# Patient Record
Sex: Female | Born: 1950 | Race: Black or African American | Hispanic: No
Health system: Southern US, Community
[De-identification: ages and names within clinical notes are randomized; demographics above are authoritative.]

## PROBLEM LIST (undated history)

## (undated) DIAGNOSIS — J9621 Acute and chronic respiratory failure with hypoxia: Secondary | ICD-10-CM

## (undated) DIAGNOSIS — I482 Chronic atrial fibrillation, unspecified: Secondary | ICD-10-CM

## (undated) DIAGNOSIS — J189 Pneumonia, unspecified organism: Secondary | ICD-10-CM

## (undated) DIAGNOSIS — R6521 Severe sepsis with septic shock: Secondary | ICD-10-CM

## (undated) DIAGNOSIS — A419 Sepsis, unspecified organism: Secondary | ICD-10-CM

## (undated) DIAGNOSIS — J90 Pleural effusion, not elsewhere classified: Secondary | ICD-10-CM

## (undated) DIAGNOSIS — K651 Peritoneal abscess: Secondary | ICD-10-CM

## (undated) DIAGNOSIS — I420 Dilated cardiomyopathy: Secondary | ICD-10-CM

## (undated) HISTORY — PX: BREAST SURGERY: SHX581

## (undated) HISTORY — PX: KNEE SURGERY: SHX244

## (undated) HISTORY — PX: COLECTOMY: SHX59

## (undated) HISTORY — PX: TUBAL LIGATION: SHX77

---

## 2017-04-04 ENCOUNTER — Inpatient Hospital Stay
Admission: AC | Admit: 2017-04-04 | Discharge: 2017-04-25 | Disposition: A | Discharge status: Skilled Nursing Facility | Payer: Self-pay | Source: Other Acute Inpatient Hospital | Attending: Internal Medicine | Admitting: Internal Medicine

## 2017-04-04 DIAGNOSIS — J9690 Respiratory failure, unspecified, unspecified whether with hypoxia or hypercapnia: Secondary | ICD-10-CM

## 2017-04-04 DIAGNOSIS — Z931 Gastrostomy status: Secondary | ICD-10-CM

## 2017-04-05 LAB — CBC WITH DIFFERENTIAL/PLATELET
BASOS PCT: 0 %
Basophils Absolute: 0.1 10*3/uL (ref 0.0–0.1)
EOS ABS: 0.1 10*3/uL (ref 0.0–0.7)
Eosinophils Relative: 1 %
HEMATOCRIT: 29.9 % — AB (ref 36.0–46.0)
HEMOGLOBIN: 9.8 g/dL — AB (ref 12.0–15.0)
LYMPHS ABS: 4 10*3/uL (ref 0.7–4.0)
Lymphocytes Relative: 33 %
MCH: 30.2 pg (ref 26.0–34.0)
MCHC: 32.8 g/dL (ref 30.0–36.0)
MCV: 92.3 fL (ref 78.0–100.0)
MONO ABS: 0.7 10*3/uL (ref 0.1–1.0)
Monocytes Relative: 6 %
NEUTROS ABS: 7.2 10*3/uL (ref 1.7–7.7)
NEUTROS PCT: 60 %
Platelets: 464 10*3/uL — ABNORMAL HIGH (ref 150–400)
RBC: 3.24 MIL/uL — ABNORMAL LOW (ref 3.87–5.11)
RDW: 17.5 % — AB (ref 11.5–15.5)
WBC: 12.1 10*3/uL — ABNORMAL HIGH (ref 4.0–10.5)

## 2017-04-05 LAB — COMPREHENSIVE METABOLIC PANEL
ALBUMIN: 2.2 g/dL — AB (ref 3.5–5.0)
ALK PHOS: 97 U/L (ref 38–126)
ALT: 9 U/L — AB (ref 14–54)
AST: 20 U/L (ref 15–41)
Anion gap: 11 (ref 5–15)
BUN: 30 mg/dL — ABNORMAL HIGH (ref 6–20)
CALCIUM: 9.1 mg/dL (ref 8.9–10.3)
CHLORIDE: 105 mmol/L (ref 101–111)
CO2: 21 mmol/L — ABNORMAL LOW (ref 22–32)
CREATININE: 2.31 mg/dL — AB (ref 0.44–1.00)
GFR calc Af Amer: 24 mL/min — ABNORMAL LOW (ref >60)
GFR calc non Af Amer: 21 mL/min — ABNORMAL LOW (ref >60)
GLUCOSE: 111 mg/dL — AB (ref 65–99)
Potassium: 3.5 mmol/L (ref 3.5–5.1)
SODIUM: 137 mmol/L (ref 135–145)
Total Bilirubin: 0.7 mg/dL (ref 0.3–1.2)
Total Protein: 7.1 g/dL (ref 6.5–8.1)

## 2017-04-05 LAB — PROTIME-INR
INR: 1.7
Prothrombin Time: 19.8 seconds — ABNORMAL HIGH (ref 11.4–15.2)

## 2017-04-05 MED ORDER — GENERIC EXTERNAL MEDICATION
200.00 | Status: DC
Start: 2017-04-06 — End: 2017-04-05

## 2017-04-05 MED ORDER — GENERIC EXTERNAL MEDICATION
2.00 | Status: DC
Start: 2017-04-05 — End: 2017-04-05

## 2017-04-05 MED ORDER — GENERIC EXTERNAL MEDICATION
Status: DC
Start: ? — End: 2017-04-05

## 2017-04-05 MED ORDER — GENERIC EXTERNAL MEDICATION
2.00 | Status: DC
Start: ? — End: 2017-04-05

## 2017-04-05 MED ORDER — METOPROLOL TARTRATE 50 MG PO TABS
50.00 | ORAL_TABLET | ORAL | Status: DC
Start: 2017-04-04 — End: 2017-04-05

## 2017-04-05 MED ORDER — PANTOPRAZOLE SODIUM 40 MG PO TBEC
40.00 | DELAYED_RELEASE_TABLET | ORAL | Status: DC
Start: 2017-04-05 — End: 2017-04-05

## 2017-04-05 MED ORDER — ONDANSETRON HCL 4 MG/2ML IJ SOLN
4.00 | INTRAMUSCULAR | Status: DC
Start: ? — End: 2017-04-05

## 2017-04-05 MED ORDER — GENERIC EXTERNAL MEDICATION
1.00 | Status: DC
Start: 2017-04-05 — End: 2017-04-05

## 2017-04-05 MED ORDER — APIXABAN 2.5 MG PO TABS
2.50 | ORAL_TABLET | ORAL | Status: DC
Start: 2017-04-04 — End: 2017-04-05

## 2017-04-05 MED ORDER — METRONIDAZOLE IN NACL 5-0.79 MG/ML-% IV SOLN
500.00 | INTRAVENOUS | Status: DC
Start: 2017-04-04 — End: 2017-04-05

## 2017-04-05 MED ORDER — METOPROLOL TARTRATE 5 MG/5ML IV SOLN
5.00 | INTRAVENOUS | Status: DC
Start: ? — End: 2017-04-05

## 2017-04-05 MED ORDER — ACETAMINOPHEN 325 MG PO TABS
650.00 | ORAL_TABLET | ORAL | Status: DC
Start: ? — End: 2017-04-05

## 2017-04-05 MED ORDER — ISOSORB DINITRATE-HYDRALAZINE 20-37.5 MG PO TABS
1.00 | ORAL_TABLET | ORAL | Status: DC
Start: 2017-04-04 — End: 2017-04-05

## 2017-04-05 MED ORDER — HEPARIN SODIUM (PORCINE) 1000 UNIT/ML IJ SOLN
1000.00 | INTRAMUSCULAR | Status: DC
Start: ? — End: 2017-04-05

## 2017-04-05 MED ORDER — GENERIC EXTERNAL MEDICATION
450.00 | Status: DC
Start: ? — End: 2017-04-05

## 2017-04-05 MED ORDER — MEGESTROL ACETATE 400 MG/10ML PO SUSP
400.00 | ORAL | Status: DC
Start: 2017-04-05 — End: 2017-04-05

## 2017-04-05 NOTE — Consult Note (Signed)
[facility] CONSULT NOTE    Date: 04/05/2017                  Patient Name:  Elizabeth Rodriguez  MRN: [id number]  DOB: 1950/06/23  Age / Sex: 67 y.o., female         PCP: No primary care provider on file.                 Service Requesting Consult: Hospitalist                 Reason for Consult: Acute renal failure            History of Present Illness: Patient is a 67 y.o. female with a PMHx of atrial fibrillation, hypertension, osteoarthritis, congestive heart failure ejection fraction 30-35%, recent peritonitis from perforated sigmoid colon status post sigmoid colectomy with colostomy placement, acute renal failure requiring dialysis, acute respiratory failure requiring ventilator support, recent septic shock due to perforated viscus who was admitted to [place] for ongoing treatment.  Patient was admitted to outside hospital from 03/05/17 the April 04, 2017.  Patient originally presented with abdominal pain.  She was found to have perforated viscus and was taken to the operating room.  She was found to have a 3 cm tear in the anterior wall of the distal sigmoid with stool in the pelvis as well as a large amount of purulent fluid in the abdomen.  Subsequently colectomy was performed and colostomy was placed.  Given the septic shock she developed acute renal failure and required dialysis.  Her last dialysis treatment was on March 24, 2017.  Her renal function appears to be improving.  BUN is currently 30 with a creatinine of 2.3 and EGFR of 24.  She also appears to have mild anemia with a hemoglobin of 9.8.   Medications: acetaminophen (TYLENOL) 325 mg tablet Take 2 tablets (650 mg total) by mouth every 6 (six) hours as needed for mild pain or fever .  apixaban (ELIQUIS) 2.5 mg tablet Take 1 tablet (2.5 mg total) by mouth two times a day .  calcium carbonate (TUMS) 200 mg calcium (500 mg) chewable tablet Take 2 tablets (1,000 mg total) by mouth three times  a day as needed for indigestion or heartburn .  cefTRIAXone 2 g in sodium chloride 0.9 % 50 mL IVPB Infuse 2 g into a venous catheter daily .  isosorbide-hydrALAZINE (BIDIL) 20-37.5 mg per tablet Take 1 tablet by mouth two times a day .  megestrol (MEGACE) 400 mg/10 mL (10 mL) suspension oral suspension Take 10 mL (400 mg total) by mouth every morning .  metoprolol tartrate (LOPRESSOR) 50 mg tablet Take 1 tablet (50 mg total) by mouth every 6 (six) hours .  MetroNIDAZOLE in NaCl, iso-os, (FLAGYL) 500 mg/100 mL IVPB Infuse 100 mL (500 mg total) into a venous catheter every 8 (eight) hours .  multivitamin with folic acid (DIALYVITE) tablet tablet Take 1 tablet by mouth daily .  ondansetron (ZOFRAN) 2 mg/mL injection Infuse 2 mL (4 mg total) into a venous catheter every 6 (six) hours as needed for nausea .  oxyCODONE-acetaminophen (PERCOCET) 5-325 mg per tablet Take 2 tablets by mouth every 4 (four) hours as needed for severe pain .  oxyCODONE-acetaminophen (PERCOCET) 5-325 mg per tablet Take 1 tablet by mouth every 4 (four) hours as needed for moderate pain .  pantoprazole (PROTONIX) 40 mg EC tablet Take 1 tablet (40 mg total) by mouth daily .  Allergies: No known drug allergies   Past Medical History: Atrial fibrillation Hypertension Chronic kidney disease stage III baseline creatinine 1.5 Perforated viscus status post sigmoid colectomy and colostomy Vitamin D deficiency Recent septic shock Recent acute respiratory failure status post extubation Congestive heart failure ejection fraction 30-35%    Past Surgical History: Carpal tunnel release Bilateral tubal ligation Breast lumpectomy Left knee surgery Sigmoid colectomy and colostomy placement    Family History: Family history significant for hypertension.  Social History: Lives in [place], [place].  Denies tobacco, ETOH, or illicits.  Review of Systems: Review of Systems  Constitutional: Positive for  malaise/fatigue. Negative for chills and fever.  HENT: Negative for hearing loss, nosebleeds and tinnitus.   Eyes: Negative for blurred vision, double vision and photophobia.  Respiratory: Positive for cough and shortness of breath.   Cardiovascular: Negative for chest pain, palpitations and orthopnea.  Gastrointestinal: Positive for abdominal pain. Negative for heartburn, nausea and vomiting.  Genitourinary: Negative for dysuria, frequency and urgency.  Musculoskeletal: Negative for joint pain and myalgias.  Skin: Negative for itching and rash.  Neurological: Positive for weakness. Negative for dizziness and focal weakness.  Endo/Heme/Allergies: Negative for polydipsia. Does not bruise/bleed easily.  Psychiatric/Behavioral: Negative for depression and hallucinations.     Vital Signs: Temperature 98.8 pulse 106 respirations 31 blood pressure 121/81  Weight trends: There were no vitals filed for this visit.  Physical Exam: General: NAD, laying in bed  Head: Normocephalic, atraumatic.  Eyes: Anicteric, EOMI  Nose: Mucous membranes moist, not inflammed, nonerythematous.  Throat: Oropharynx nonerythematous, no exudate appreciated.   Neck: Supple, trachea midline.  Lungs:  Normal respiratory effort. Clear to auscultation BL without crackles or wheezes.  Heart: RRR. S1 and S2 normal without gallop, murmur, or rubs.  Abdomen:  BS normal, colostomy in place, wound vac in place  Extremities: No pretibial edema.  Neurologic: A&O X3, Motor strength is 5/5 in the all 4 extremities  Skin: No visible rashes, scars.    Lab results: Basic Metabolic Panel: Recent Labs  Lab 04/05/17 0635  NA 137  K 3.5  CL 105  CO2 21*  GLUCOSE 111*  BUN 30*  CREATININE 2.31*  CALCIUM 9.1    Liver Function Tests: Recent Labs  Lab 04/05/17 0635  AST 20  ALT 9*  ALKPHOS 97  BILITOT 0.7  PROT 7.1  ALBUMIN 2.2*   No results for input(s): LIPASE, AMYLASE in the last 168 hours. No results for  input(s): AMMONIA in the last 168 hours.  CBC: Recent Labs  Lab 04/05/17 0635  WBC 12.1*  NEUTROABS 7.2  HGB 9.8*  HCT 29.9*  MCV 92.3  PLT 464*    Cardiac Enzymes: No results for input(s): CKTOTAL, CKMB, CKMBINDEX, TROPONINI in the last 168 hours.  BNP: Invalid input(s): POCBNP  CBG: No results for input(s): GLUCAP in the last 168 hours.  Microbiology: No results found for this or any previous visit.  Coagulation Studies: Recent Labs    04/05/17 0635  LABPROT 19.8*  INR 1.70    Urinalysis: No results for input(s): COLORURINE, LABSPEC, PHURINE, GLUCOSEU, HGBUR, BILIRUBINUR, KETONESUR, PROTEINUR, UROBILINOGEN, NITRITE, LEUKOCYTESUR in the last 72 hours.  Invalid input(s): APPERANCEUR    Imaging:  No results found.   Assessment & Plan: Pt is a 67 y.o. female with a PMHx of atrial fibrillation, hypertension, osteoarthritis, congestive heart failure ejection fraction 30-35%, recent peritonitis from perforated sigmoid colon status post sigmoid colectomy with colostomy placement, acute renal failure requiring dialysis, acute respiratory failure requiring  ventilator support, recent septic shock due to perforated viscus who was admitted to [place] for ongoing treatment.  1.  Acute renal failure/chronic kidney disease stage III.  The patient's renal function appears to be improving.  Creatinine currently down to 2.31.  BUN currently 30.  No acute indication for dialysis.  Encourage the patient to maintain adequate fluid hydration status.  Baseline creatinine appears to be 1.5 but unclear as to whether she will reach her prior baseline given the severity of her acute renal failure recently.  2.  Anemia of chronic kidney disease.  Hemoglobin currently 9.8.  No indication for Procrit at the moment.  Continue to periodically monitor CBC.  3.  Hypertension.  Maintain the patient on metoprolol for now.  4.  Thanks for consultation.

## 2017-04-12 LAB — COMPREHENSIVE METABOLIC PANEL
ALT: 9 U/L — ABNORMAL LOW (ref 14–54)
ANION GAP: 6 (ref 5–15)
AST: 23 U/L (ref 15–41)
Albumin: 2.2 g/dL — ABNORMAL LOW (ref 3.5–5.0)
Alkaline Phosphatase: 67 U/L (ref 38–126)
BILIRUBIN TOTAL: 0.4 mg/dL (ref 0.3–1.2)
BUN: 16 mg/dL (ref 6–20)
CHLORIDE: 113 mmol/L — AB (ref 101–111)
CO2: 21 mmol/L — ABNORMAL LOW (ref 22–32)
Calcium: 9.1 mg/dL (ref 8.9–10.3)
Creatinine, Ser: 1.14 mg/dL — ABNORMAL HIGH (ref 0.44–1.00)
GFR, EST AFRICAN AMERICAN: 57 mL/min — AB (ref >60)
GFR, EST NON AFRICAN AMERICAN: 49 mL/min — AB (ref >60)
Glucose, Bld: 108 mg/dL — ABNORMAL HIGH (ref 65–99)
POTASSIUM: 3.7 mmol/L (ref 3.5–5.1)
Sodium: 140 mmol/L (ref 135–145)
TOTAL PROTEIN: 6.5 g/dL (ref 6.5–8.1)

## 2017-04-12 LAB — CBC WITH DIFFERENTIAL/PLATELET
BASOS ABS: 0.1 10*3/uL (ref 0.0–0.1)
Basophils Relative: 1 %
EOS PCT: 3 %
Eosinophils Absolute: 0.2 10*3/uL (ref 0.0–0.7)
HCT: 28.9 % — ABNORMAL LOW (ref 36.0–46.0)
HEMOGLOBIN: 9.1 g/dL — AB (ref 12.0–15.0)
LYMPHS ABS: 2.8 10*3/uL (ref 0.7–4.0)
LYMPHS PCT: 37 %
MCH: 29.2 pg (ref 26.0–34.0)
MCHC: 31.5 g/dL (ref 30.0–36.0)
MCV: 92.6 fL (ref 78.0–100.0)
Monocytes Absolute: 0.7 10*3/uL (ref 0.1–1.0)
Monocytes Relative: 8 %
NEUTROS ABS: 4 10*3/uL (ref 1.7–7.7)
NEUTROS PCT: 51 %
Platelets: 507 10*3/uL — ABNORMAL HIGH (ref 150–400)
RBC: 3.12 MIL/uL — AB (ref 3.87–5.11)
RDW: 19 % — ABNORMAL HIGH (ref 11.5–15.5)
WBC: 7.7 10*3/uL (ref 4.0–10.5)

## 2017-04-12 LAB — PROTIME-INR
INR: 1.63
PROTHROMBIN TIME: 19.2 s — AB (ref 11.4–15.2)

## 2017-04-15 ENCOUNTER — Encounter (HOSPITAL_COMMUNITY): Payer: Self-pay

## 2017-04-16 ENCOUNTER — Encounter (HOSPITAL_BASED_OUTPATIENT_CLINIC_OR_DEPARTMENT_OTHER): Payer: Self-pay

## 2017-04-16 DIAGNOSIS — M79609 Pain in unspecified limb: Secondary | ICD-10-CM

## 2017-04-16 LAB — DIGOXIN LEVEL: DIGOXIN LVL: 0.8 ng/mL (ref 0.8–2.0)

## 2017-04-16 MED ORDER — GENERIC EXTERNAL MEDICATION
Status: DC
Start: ? — End: 2017-04-16

## 2017-04-16 NOTE — Progress Notes (Signed)
VASCULAR LAB PRELIMINARY  PRELIMINARY  PRELIMINARY  PRELIMINARY  Bilateral lower extremity venous duplex completed.    Preliminary report:  There is no DVT or SVT noted in the bilateral lower extremities.   Cinthya Bors, RVT 04/16/2017, 5:11 PM

## 2017-04-18 ENCOUNTER — Other Ambulatory Visit (HOSPITAL_COMMUNITY): Payer: Self-pay

## 2017-04-18 LAB — CBC WITH DIFFERENTIAL/PLATELET
Basophils Absolute: 0.1 10*3/uL (ref 0.0–0.1)
Basophils Relative: 1 %
EOS ABS: 0.2 10*3/uL (ref 0.0–0.7)
Eosinophils Relative: 2 %
HEMATOCRIT: 31 % — AB (ref 36.0–46.0)
HEMOGLOBIN: 10 g/dL — AB (ref 12.0–15.0)
Lymphocytes Relative: 52 %
Lymphs Abs: 4 10*3/uL (ref 0.7–4.0)
MCH: 30.7 pg (ref 26.0–34.0)
MCHC: 32.3 g/dL (ref 30.0–36.0)
MCV: 95.1 fL (ref 78.0–100.0)
Monocytes Absolute: 0.4 10*3/uL (ref 0.1–1.0)
Monocytes Relative: 5 %
NEUTROS ABS: 3.1 10*3/uL (ref 1.7–7.7)
NEUTROS PCT: 40 %
Platelets: 492 10*3/uL — ABNORMAL HIGH (ref 150–400)
RBC: 3.26 MIL/uL — AB (ref 3.87–5.11)
RDW: 18.6 % — ABNORMAL HIGH (ref 11.5–15.5)
WBC: 7.6 10*3/uL (ref 4.0–10.5)

## 2017-04-18 LAB — BASIC METABOLIC PANEL
ANION GAP: 6 (ref 5–15)
BUN: 26 mg/dL — ABNORMAL HIGH (ref 6–20)
CHLORIDE: 112 mmol/L — AB (ref 101–111)
CO2: 21 mmol/L — AB (ref 22–32)
CREATININE: 1 mg/dL (ref 0.44–1.00)
Calcium: 9.7 mg/dL (ref 8.9–10.3)
GFR calc non Af Amer: 57 mL/min — ABNORMAL LOW (ref >60)
Glucose, Bld: 157 mg/dL — ABNORMAL HIGH (ref 65–99)
POTASSIUM: 4.5 mmol/L (ref 3.5–5.1)
SODIUM: 139 mmol/L (ref 135–145)

## 2017-04-18 MED ORDER — GENERIC EXTERNAL MEDICATION
Status: DC
Start: ? — End: 2017-04-18

## 2017-04-19 LAB — CBC WITH DIFFERENTIAL/PLATELET
Basophils Absolute: 0.1 10*3/uL (ref 0.0–0.1)
Basophils Relative: 1 %
EOS PCT: 3 %
Eosinophils Absolute: 0.2 10*3/uL (ref 0.0–0.7)
HEMATOCRIT: 33.3 % — AB (ref 36.0–46.0)
Hemoglobin: 10.4 g/dL — ABNORMAL LOW (ref 12.0–15.0)
LYMPHS ABS: 3.6 10*3/uL (ref 0.7–4.0)
LYMPHS PCT: 49 %
MCH: 29.5 pg (ref 26.0–34.0)
MCHC: 31.2 g/dL (ref 30.0–36.0)
MCV: 94.6 fL (ref 78.0–100.0)
MONO ABS: 0.5 10*3/uL (ref 0.1–1.0)
MONOS PCT: 7 %
Neutro Abs: 2.9 10*3/uL (ref 1.7–7.7)
Neutrophils Relative %: 40 %
PLATELETS: 438 10*3/uL — AB (ref 150–400)
RBC: 3.52 MIL/uL — ABNORMAL LOW (ref 3.87–5.11)
RDW: 18.1 % — AB (ref 11.5–15.5)
WBC: 7.3 10*3/uL (ref 4.0–10.5)

## 2017-04-19 LAB — COMPREHENSIVE METABOLIC PANEL
ALBUMIN: 2.4 g/dL — AB (ref 3.5–5.0)
ALK PHOS: 75 U/L (ref 38–126)
ALT: 12 U/L — ABNORMAL LOW (ref 14–54)
AST: 20 U/L (ref 15–41)
Anion gap: 11 (ref 5–15)
BUN: 27 mg/dL — AB (ref 6–20)
CO2: 20 mmol/L — AB (ref 22–32)
Calcium: 9.8 mg/dL (ref 8.9–10.3)
Chloride: 110 mmol/L (ref 101–111)
Creatinine, Ser: 0.99 mg/dL (ref 0.44–1.00)
GFR calc Af Amer: >60 mL/min (ref >60)
GFR calc non Af Amer: 58 mL/min — ABNORMAL LOW (ref >60)
GLUCOSE: 106 mg/dL — AB (ref 65–99)
POTASSIUM: 3.9 mmol/L (ref 3.5–5.1)
SODIUM: 141 mmol/L (ref 135–145)
Total Bilirubin: 0.5 mg/dL (ref 0.3–1.2)
Total Protein: 6.7 g/dL (ref 6.5–8.1)

## 2017-04-19 LAB — PROTIME-INR
INR: 1.41
Prothrombin Time: 17.2 seconds — ABNORMAL HIGH (ref 11.4–15.2)

## 2017-06-05 ENCOUNTER — Inpatient Hospital Stay
Admission: AD | Admit: 2017-06-05 | Discharge: 2017-07-11 | Disposition: A | Discharge status: Skilled Nursing Facility | Payer: Medicare Other | Source: Ambulatory Visit | Attending: Internal Medicine | Admitting: Internal Medicine

## 2017-06-05 DIAGNOSIS — J189 Pneumonia, unspecified organism: Secondary | ICD-10-CM

## 2017-06-05 DIAGNOSIS — R6521 Severe sepsis with septic shock: Secondary | ICD-10-CM

## 2017-06-05 DIAGNOSIS — R079 Chest pain, unspecified: Secondary | ICD-10-CM

## 2017-06-05 DIAGNOSIS — J9621 Acute and chronic respiratory failure with hypoxia: Secondary | ICD-10-CM | POA: Diagnosis present

## 2017-06-05 DIAGNOSIS — I482 Chronic atrial fibrillation, unspecified: Secondary | ICD-10-CM | POA: Diagnosis present

## 2017-06-05 DIAGNOSIS — I420 Dilated cardiomyopathy: Secondary | ICD-10-CM | POA: Diagnosis present

## 2017-06-05 DIAGNOSIS — Z931 Gastrostomy status: Secondary | ICD-10-CM

## 2017-06-05 DIAGNOSIS — A419 Sepsis, unspecified organism: Secondary | ICD-10-CM

## 2017-06-05 DIAGNOSIS — Z95828 Presence of other vascular implants and grafts: Secondary | ICD-10-CM

## 2017-06-05 DIAGNOSIS — Z4659 Encounter for fitting and adjustment of other gastrointestinal appliance and device: Secondary | ICD-10-CM

## 2017-06-05 DIAGNOSIS — J9690 Respiratory failure, unspecified, unspecified whether with hypoxia or hypercapnia: Secondary | ICD-10-CM

## 2017-06-05 DIAGNOSIS — J90 Pleural effusion, not elsewhere classified: Secondary | ICD-10-CM

## 2017-06-05 DIAGNOSIS — L0291 Cutaneous abscess, unspecified: Secondary | ICD-10-CM

## 2017-06-05 DIAGNOSIS — K651 Peritoneal abscess: Secondary | ICD-10-CM | POA: Diagnosis present

## 2017-06-05 DIAGNOSIS — N939 Abnormal uterine and vaginal bleeding, unspecified: Secondary | ICD-10-CM

## 2017-06-05 DIAGNOSIS — K567 Ileus, unspecified: Secondary | ICD-10-CM

## 2017-06-05 DIAGNOSIS — Z9289 Personal history of other medical treatment: Secondary | ICD-10-CM

## 2017-06-05 HISTORY — DX: Dilated cardiomyopathy: I42.0

## 2017-06-05 HISTORY — DX: Pleural effusion, not elsewhere classified: J90

## 2017-06-05 HISTORY — DX: Pneumonia, unspecified organism: J18.9

## 2017-06-05 HISTORY — DX: Chronic atrial fibrillation, unspecified: I48.20

## 2017-06-05 HISTORY — DX: Acute and chronic respiratory failure with hypoxia: J96.21

## 2017-06-05 HISTORY — DX: Severe sepsis with septic shock: R65.21

## 2017-06-05 HISTORY — DX: Peritoneal abscess: K65.1

## 2017-06-05 HISTORY — DX: Sepsis, unspecified organism: A41.9

## 2017-06-06 LAB — COMPREHENSIVE METABOLIC PANEL
ALBUMIN: 2.1 g/dL — AB (ref 3.5–5.0)
ALT: 10 U/L — ABNORMAL LOW (ref 14–54)
ANION GAP: 9 (ref 5–15)
AST: 15 U/L (ref 15–41)
Alkaline Phosphatase: 99 U/L (ref 38–126)
BUN: 11 mg/dL (ref 6–20)
CHLORIDE: 106 mmol/L (ref 101–111)
CO2: 25 mmol/L (ref 22–32)
Calcium: 8.8 mg/dL — ABNORMAL LOW (ref 8.9–10.3)
Creatinine, Ser: 1.11 mg/dL — ABNORMAL HIGH (ref 0.44–1.00)
GFR calc Af Amer: 59 mL/min — ABNORMAL LOW (ref >60)
GFR calc non Af Amer: 51 mL/min — ABNORMAL LOW (ref >60)
GLUCOSE: 99 mg/dL (ref 65–99)
POTASSIUM: 4.2 mmol/L (ref 3.5–5.1)
Sodium: 140 mmol/L (ref 135–145)
Total Bilirubin: 0.6 mg/dL (ref 0.3–1.2)
Total Protein: 5.9 g/dL — ABNORMAL LOW (ref 6.5–8.1)

## 2017-06-06 LAB — CBC WITH DIFFERENTIAL/PLATELET
Basophils Absolute: 0 10*3/uL (ref 0.0–0.1)
Basophils Relative: 0 %
EOS PCT: 2 %
Eosinophils Absolute: 0.2 10*3/uL (ref 0.0–0.7)
HEMATOCRIT: 24.9 % — AB (ref 36.0–46.0)
Hemoglobin: 8 g/dL — ABNORMAL LOW (ref 12.0–15.0)
LYMPHS ABS: 2.8 10*3/uL (ref 0.7–4.0)
LYMPHS PCT: 40 %
MCH: 30.1 pg (ref 26.0–34.0)
MCHC: 32.1 g/dL (ref 30.0–36.0)
MCV: 93.6 fL (ref 78.0–100.0)
Monocytes Absolute: 0.5 10*3/uL (ref 0.1–1.0)
Monocytes Relative: 7 %
NEUTROS ABS: 3.5 10*3/uL (ref 1.7–7.7)
Neutrophils Relative %: 51 %
PLATELETS: 422 10*3/uL — AB (ref 150–400)
RBC: 2.66 MIL/uL — ABNORMAL LOW (ref 3.87–5.11)
RDW: 16.4 % — ABNORMAL HIGH (ref 11.5–15.5)
WBC: 7 10*3/uL (ref 4.0–10.5)

## 2017-06-06 LAB — PROTIME-INR
INR: 1.44
Prothrombin Time: 17.4 seconds — ABNORMAL HIGH (ref 11.4–15.2)

## 2017-06-06 LAB — MAGNESIUM: MAGNESIUM: 1.7 mg/dL (ref 1.7–2.4)

## 2017-06-06 LAB — APTT: aPTT: 32 seconds (ref 24–36)

## 2017-06-07 ENCOUNTER — Other Ambulatory Visit (HOSPITAL_COMMUNITY): Payer: Medicare Other

## 2017-06-10 ENCOUNTER — Other Ambulatory Visit (HOSPITAL_COMMUNITY): Payer: Medicare Other

## 2017-06-11 LAB — CBC
HEMATOCRIT: 28.8 % — AB (ref 36.0–46.0)
HEMOGLOBIN: 8.8 g/dL — AB (ref 12.0–15.0)
MCH: 29.3 pg (ref 26.0–34.0)
MCHC: 30.6 g/dL (ref 30.0–36.0)
MCV: 96 fL (ref 78.0–100.0)
PLATELETS: 342 10*3/uL (ref 150–400)
RBC: 3 MIL/uL — ABNORMAL LOW (ref 3.87–5.11)
RDW: 15.9 % — ABNORMAL HIGH (ref 11.5–15.5)
WBC: 6 10*3/uL (ref 4.0–10.5)

## 2017-06-16 ENCOUNTER — Other Ambulatory Visit (HOSPITAL_COMMUNITY): Payer: Medicare Other

## 2017-06-16 LAB — COMPREHENSIVE METABOLIC PANEL
ALK PHOS: 89 U/L (ref 38–126)
ALT: 13 U/L — AB (ref 14–54)
AST: 24 U/L (ref 15–41)
Albumin: 2.5 g/dL — ABNORMAL LOW (ref 3.5–5.0)
Anion gap: 8 (ref 5–15)
BILIRUBIN TOTAL: 0.5 mg/dL (ref 0.3–1.2)
BUN: 10 mg/dL (ref 6–20)
CALCIUM: 9.1 mg/dL (ref 8.9–10.3)
CO2: 26 mmol/L (ref 22–32)
CREATININE: 1.27 mg/dL — AB (ref 0.44–1.00)
Chloride: 106 mmol/L (ref 101–111)
GFR calc Af Amer: 50 mL/min — ABNORMAL LOW (ref >60)
GFR calc non Af Amer: 43 mL/min — ABNORMAL LOW (ref >60)
GLUCOSE: 128 mg/dL — AB (ref 65–99)
Potassium: 3.8 mmol/L (ref 3.5–5.1)
SODIUM: 140 mmol/L (ref 135–145)
Total Protein: 6.5 g/dL (ref 6.5–8.1)

## 2017-06-16 LAB — URINALYSIS, ROUTINE W REFLEX MICROSCOPIC
BILIRUBIN URINE: NEGATIVE
GLUCOSE, UA: NEGATIVE mg/dL
HGB URINE DIPSTICK: NEGATIVE
Ketones, ur: NEGATIVE mg/dL
Leukocytes, UA: NEGATIVE
NITRITE: NEGATIVE
PH: 5 (ref 5.0–8.0)
Protein, ur: 30 mg/dL — AB
SPECIFIC GRAVITY, URINE: 1.017 (ref 1.005–1.030)

## 2017-06-16 LAB — CBC WITH DIFFERENTIAL/PLATELET
ABS IMMATURE GRANULOCYTES: 0 10*3/uL (ref 0.0–0.1)
BASOS ABS: 0 10*3/uL (ref 0.0–0.1)
Basophils Relative: 0 %
Eosinophils Absolute: 0.1 10*3/uL (ref 0.0–0.7)
Eosinophils Relative: 1 %
HEMATOCRIT: 30.4 % — AB (ref 36.0–46.0)
HEMOGLOBIN: 8.9 g/dL — AB (ref 12.0–15.0)
Immature Granulocytes: 1 %
LYMPHS ABS: 3.7 10*3/uL (ref 0.7–4.0)
LYMPHS PCT: 46 %
MCH: 29.4 pg (ref 26.0–34.0)
MCHC: 29.3 g/dL — ABNORMAL LOW (ref 30.0–36.0)
MCV: 100.3 fL — ABNORMAL HIGH (ref 78.0–100.0)
MONO ABS: 0.6 10*3/uL (ref 0.1–1.0)
MONOS PCT: 8 %
NEUTROS ABS: 3.4 10*3/uL (ref 1.7–7.7)
Neutrophils Relative %: 44 %
Platelets: 367 10*3/uL (ref 150–400)
RBC: 3.03 MIL/uL — ABNORMAL LOW (ref 3.87–5.11)
RDW: 15.6 % — ABNORMAL HIGH (ref 11.5–15.5)
WBC: 7.9 10*3/uL (ref 4.0–10.5)

## 2017-06-16 LAB — AMMONIA: AMMONIA: 41 umol/L — AB (ref 9–35)

## 2017-06-16 MED ORDER — IOHEXOL 300 MG/ML  SOLN
100.0000 mL | Freq: Once | INTRAMUSCULAR | Status: AC | PRN
  Administered 2017-06-16: 100 mL via INTRAVENOUS

## 2017-06-17 LAB — URINE CULTURE: CULTURE: NO GROWTH

## 2017-06-17 LAB — CBC
HEMATOCRIT: 30.1 % — AB (ref 36.0–46.0)
HEMOGLOBIN: 8.8 g/dL — AB (ref 12.0–15.0)
MCH: 29.1 pg (ref 26.0–34.0)
MCHC: 29.2 g/dL — AB (ref 30.0–36.0)
MCV: 99.7 fL (ref 78.0–100.0)
Platelets: 339 10*3/uL (ref 150–400)
RBC: 3.02 MIL/uL — ABNORMAL LOW (ref 3.87–5.11)
RDW: 15.6 % — AB (ref 11.5–15.5)
WBC: 6.9 10*3/uL (ref 4.0–10.5)

## 2017-06-17 LAB — PROTIME-INR
INR: 1.08
Prothrombin Time: 13.9 seconds (ref 11.4–15.2)

## 2017-06-21 ENCOUNTER — Other Ambulatory Visit (HOSPITAL_COMMUNITY): Payer: Medicare Other

## 2017-06-21 NOTE — Consult Note (Signed)
Chief Complaint: Patient was seen in consultation today for intraabdominal abscess.  Referring Physician(s): Carron Curie  Supervising Physician: Malachy Moan  Patient Status: [facility] - In-pt  History of Present Illness: Elizabeth Rodriguez is a 67 y.o. female with a past medical history of hypertension, atrial fibrillation, dilated cardiomyopathy, and uterine prolapse. She was admitted to [facility] for sepsis.  CT abdomen/pelvis 06/16/2017: 1. 6.7 x 5.8 cm sub diaphragmatic fluid collection is seen just superior to the liver concerning for abscess. Another 9.4 x 6.6 cm fluid collection is seen superior to the spleen which may represent developing abscess. 2. Previously placed percutaneous drainage catheter in left side of abdomen has no surrounding fluid collection at this time. 3. Status post sigmoid colectomy with left lower quadrant colostomy. 4. Moderate bilateral pleural effusions are noted with adjacent subsegmental atelectasis. 5. Small amount of mildly dense fluid is noted posterior in the pelvis which may represent small amount of postoperative blood or other free fluid. 6. Aortic Atherosclerosis.  IR requested by Dr. Sharyon Medicus for possible image-guided percutaneous intraabdominal abscess drain placement. Patient awake and alert laying in bed with no complaints at this time. Accompanied by daughter at bedside. Denies fever, chest pain, dyspnea, abdominal pain, or dizziness.  Patient is prescribed Eliquis for atrial fibrillation. It has been held at this time, with last dose given   No past medical history on file.  Allergies: Patient has no allergy information on record.  Medications: Prior to Admission medications   Not on File     No family history on file.  Social History   Socioeconomic History  . Marital status: Widowed    Spouse name: Not on file  . Number of children: Not on file  . Years of education: Not on file  . Highest education level: Not on  file  Occupational History  . Not on file  Social Needs  . Financial resource strain: Not on file  . Food insecurity:    Worry: Not on file    Inability: Not on file  . Transportation needs:    Medical: Not on file    Non-medical: Not on file  Tobacco Use  . Smoking status: Not on file  Substance and Sexual Activity  . Alcohol use: Not on file  . Drug use: Not on file  . Sexual activity: Not on file  Lifestyle  . Physical activity:    Days per week: Not on file    Minutes per session: Not on file  . Stress: Not on file  Relationships  . Social connections:    Talks on phone: Not on file    Gets together: Not on file    Attends religious service: Not on file    Active member of club or organization: Not on file    Attends meetings of clubs or organizations: Not on file    Relationship status: Not on file  Other Topics Concern  . Not on file  Social History Narrative  . Not on file     Review of Systems: A 12 point ROS discussed and pertinent positives are indicated in the HPI above.  All other systems are negative.  Review of Systems  Constitutional: Negative for activity change and fever.  Respiratory: Negative for shortness of breath and wheezing.   Cardiovascular: Negative for chest pain and palpitations.  Gastrointestinal: Negative for abdominal pain.  Neurological: Negative for dizziness.  Psychiatric/Behavioral: Negative for behavioral problems and confusion.    Vital Signs: LMP 06/13/2017 (  Approximate)   Physical Exam  Constitutional: She is oriented to person, place, and time. She appears well-developed and well-nourished. No distress.  Cardiovascular: Normal rate, regular rhythm and normal heart sounds.  No murmur heard. Pulmonary/Chest: Effort normal and breath sounds normal. No respiratory distress. She has no wheezes.  Abdominal: Soft. She exhibits no distension. There is no tenderness.  Neurological: She is alert and oriented to person, place, and  time.  Skin: Skin is warm and dry.  Psychiatric: She has a normal mood and affect. Her behavior is normal. Judgment and thought content normal.  Nursing note and vitals reviewed.    MD Evaluation Airway: WNL Heart: WNL Abdomen: WNL Chest/ Lungs: WNL ASA  Classification: 3 Mallampati/Airway Score: Two   Imaging: Ct Abdomen Pelvis W Contrast  Result Date: 06/16/2017 CLINICAL DATA:  Fever, generalized abdominal pain status post colectomy for perforated sigmoid colon. EXAM: CT ABDOMEN AND PELVIS WITH CONTRAST TECHNIQUE: Multidetector CT imaging of the abdomen and pelvis was performed using the standard protocol following bolus administration of intravenous contrast. CONTRAST:  OMNIPAQUE IOHEXOL 300 MG/ML  SOLN COMPARISON:  None. FINDINGS: Lower chest: Moderate bilateral pleural effusions are noted with adjacent subsegmental atelectasis. Hepatobiliary: No gallstones are noted. No focal abnormality is noted within the liver parenchyma. No biliary dilatation is noted. However, 6.7 x 5.8 cm subdiaphragmatic fluid collection is noted concerning for abscess. Pancreas: Unremarkable. No pancreatic ductal dilatation or surrounding inflammatory changes. Spleen: 9.4 x 6.6 cm subdiaphragmatic fluid collection is seen superior to the spleen most consistent with developing abscess. Adrenals/Urinary Tract: Adrenal glands are unremarkable. Kidneys are normal, without renal calculi, focal lesion, or hydronephrosis. Bladder is unremarkable. Stomach/Bowel: The stomach appears normal. 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 Left lower quadrant colostomy is noted. There is no evidence of bowel obstruction or inflammation. The appendix appears normal. Vascular/Lymphatic: Aortic atherosclerosis. No enlarged abdominal or pelvic lymph nodes. Reproductive: Uterus and bilateral adnexa are unremarkable. Other: Percutaneous drainage catheter is seen in the left side of the abdomen with no surrounding fluid  collection. Mild anasarca is noted. Small amount of mildly dense fluid is noted posteriorly in the pelvis which may represent small amount of postoperative blood or other free fluid. Musculoskeletal: No acute or significant osseous findings. IMPRESSION: 6.7 x 5.8 cm sub diaphragmatic fluid collection is seen just superior to the liver concerning for abscess. Another 9.4 x 6.6 cm fluid collection is seen superior to the spleen which may represent developing abscess. Previously placed percutaneous drainage catheter in left side of abdomen has no surrounding fluid collection at this time. Status post sigmoid colectomy with left lower quadrant colostomy. Moderate bilateral pleural effusions are noted with adjacent subsegmental atelectasis. Small amount of mildly dense fluid is noted posterior in the pelvis which may represent small amount of postoperative blood or other free fluid. Aortic Atherosclerosis (ICD10-I70.0). Electronically Signed   By: Lupita Raider, M.D.   On: 06/16/2017 13:59   Dg Chest Port 1 View  Result Date: 06/07/2017 CLINICAL DATA:  Shortness of breath EXAM: PORTABLE CHEST 1 VIEW COMPARISON:  04/18/2017 FINDINGS: Cardiomegaly. Layering bilateral pleural effusions. Vascular congestion and bibasilar atelectasis. Findings similar to prior study. IMPRESSION: Stable layering effusions and bibasilar atelectasis. Cardiomegaly, vascular congestion. No change. Electronically Signed   By: Charlett Nose M.D.   On: 06/07/2017 09:41   US Pelvic Complete With Transvaginal  Result Date: 06/10/2017 CLINICAL DATA:  Vaginal bleeding since yesterday. EXAM: TRANSABDOMINAL AND TRANSVAGINAL ULTRASOUND OF PELVIS TECHNIQUE: Both transabdominal and transvaginal  ultrasound examinations of the pelvis were performed. Transabdominal technique was performed for global imaging of the pelvis including uterus, ovaries, adnexal regions, and pelvic cul-de-sac. It was necessary to proceed with endovaginal exam following the  transabdominal exam to visualize the endometrium and ovaries. COMPARISON:  None FINDINGS: Exam somewhat difficult due to patient body habitus. Uterus Only visualized well transvaginally. Measurements: 4.8 x 5.5 x 8.5 cm. Diffusely heterogeneous. Small hypoechoic mass over the anterior uterine body measuring 1.4 cm in greatest diameter compatible with a small fibroid likely serosal in location. Hypoechoic mass over the posterior uterine body/fundus measuring 1.5 cm in diameter compatible small intramural fibroid. Endometrium Thickness: 7 mm. Focal fluid collection over the fundal endometrium measuring 0.9 x 1.8 x 2.1 cm. There is an oval echogenic nodule along the wall of the endometrium projecting into this fluid measuring 4 x 5 x 7 mm which may be a endometrial polyp, submucosal fibroid or neoplasm. Right ovary Not visualized. Left ovary Not visualized. Other findings No abnormal free fluid. IMPRESSION: Abnormal endometrium containing a focal fluid collection over the fundal endometrium measuring 0.9 x 1.8 x 2.1 cm. There is a focal echogenic nodule along the endometrial wall projecting into the fluid measuring 4 x 5 x 7 mm which may represent a polyp, serosal fibroid or neoplasm. Recommend GYN consultation. Two small uterine fibroids as described. Ovaries not visualized. No free fluid. Electronically Signed   By: Elberta Fortis M.D.   On: 06/10/2017 10:19    Labs:  CBC: Recent Labs    06/06/17 0709 06/11/17 0743 06/16/17 1707 06/17/17 0506  WBC 7.0 6.0 7.9 6.9  HGB 8.0* 8.8* 8.9* 8.8*  HCT 24.9* 28.8* 30.4* 30.1*  PLT 422* 342 367 339    COAGS: Recent Labs    04/12/17 0739 04/19/17 0737 06/06/17 0709 06/17/17 0506  INR 1.63 1.41 1.44 1.08  APTT  --   --  32  --     BMP: Recent Labs    04/18/17 1124 04/19/17 0737 06/06/17 0709 06/16/17 1707  NA 139 141 140 140  K 4.5 3.9 4.2 3.8  CL 112* 110 106 106  CO2 21* 20* 25 26  GLUCOSE 157* 106* 99 128*  BUN 26* 27* 11 10  CALCIUM  9.7 9.8 8.8* 9.1  CREATININE 1.00 0.99 1.11* 1.27*  GFRNONAA 57* 58* 51* 43*  GFRAA >60 >60 59* 50*    LIVER FUNCTION TESTS: Recent Labs    04/12/17 0739 04/19/17 0737 06/06/17 0709 06/16/17 1707  BILITOT 0.4 0.5 0.6 0.5  AST ALT 9* 12* 10* 13*  ALKPHOS 67 75 99 89  PROT 6.5 6.7 5.9* 6.5  ALBUMIN 2.2* 2.4* 2.1* 2.5*    TUMOR MARKERS: No results for input(s): AFPTM, CEA, CA199, CHROMGRNA in the last 8760 hours.  Assessment and Plan:  Intraabdominal abscess. Plan for image-guided percutaneous intraabdominal abscess drain placement tomorrow AM. Patient will be NPO at midnight. Denies fever and WBCs WNL. She does take Eliquis which has been held at this time, last dose 06/09/2017. INR 1.08 seconds 06/17/2017.  Risks and benefits discussed with the patient including bleeding, infection, damage to adjacent structures, bowel perforation/fistula connection, and sepsis. All of the patient's questions were answered, patient is agreeable to proceed. Consent signed and in chart.  Thank you for this interesting consult.  I greatly enjoyed meeting PORFIRIA HEINRICH and look forward to participating in their care.  A copy of this report was sent to the requesting provider on  this date.  Electronically Signed: Elwin Mocha, PA-C 06/21/2017, 3:24 PM   I spent a total of 20 Minutes in face to face in clinical consultation, greater than 50% of which was counseling/coordinating care for intraabdominal abscess.

## 2017-06-22 ENCOUNTER — Encounter (HOSPITAL_COMMUNITY): Payer: Self-pay | Admitting: Interventional Radiology

## 2017-06-22 ENCOUNTER — Other Ambulatory Visit (HOSPITAL_COMMUNITY): Payer: Medicare Other

## 2017-06-22 HISTORY — PX: IR GUIDED DRAIN W CATHETER PLACEMENT: IMG719

## 2017-06-22 MED ORDER — MIDAZOLAM HCL 2 MG/2ML IJ SOLN
INTRAMUSCULAR | Status: AC | PRN
  Administered 2017-06-22: 1 mg via INTRAVENOUS

## 2017-06-22 MED ORDER — MIDAZOLAM HCL 2 MG/2ML IJ SOLN
INTRAMUSCULAR | Status: AC
  Filled 2017-06-22: qty 4

## 2017-06-22 MED ORDER — LIDOCAINE HCL 1 % IJ SOLN
INTRAMUSCULAR | Status: AC | PRN
  Administered 2017-06-22: 5 mL

## 2017-06-22 MED ORDER — FENTANYL CITRATE (PF) 100 MCG/2ML IJ SOLN
INTRAMUSCULAR | Status: AC | PRN
  Administered 2017-06-22: 50 ug via INTRAVENOUS
  Administered 2017-06-22: 25 ug via INTRAVENOUS

## 2017-06-22 MED ORDER — FENTANYL CITRATE (PF) 100 MCG/2ML IJ SOLN
INTRAMUSCULAR | Status: AC
  Filled 2017-06-22: qty 4

## 2017-06-22 MED ORDER — LIDOCAINE HCL 1 % IJ SOLN
INTRAMUSCULAR | Status: AC
  Filled 2017-06-22: qty 20

## 2017-06-22 MED ORDER — SODIUM CHLORIDE 0.9% FLUSH: 5.0000 mL | Freq: Three times a day (TID) | INTRAVENOUS | Status: DC

## 2017-06-22 MED ORDER — IOPAMIDOL (ISOVUE-300) INJECTION 61%
INTRAVENOUS | Status: AC
  Filled 2017-06-22: qty 50

## 2017-06-22 NOTE — Procedures (Signed)
Interventional Radiology Procedure Note  Procedure: Successful placement of bilateral subphrenic drainage catheters, 48F on the left and 58F on the right.  Return of copious purulent fluid.  Samples sent for culture.   Complications: None  Estimated Blood Loss: None  Recommendations: - Drains to JP bulbs - Record output  Signed,  Sterling Big, MD

## 2017-06-24 LAB — BASIC METABOLIC PANEL
Anion gap: 9 (ref 5–15)
BUN: 11 mg/dL (ref 6–20)
CO2: 21 mmol/L — ABNORMAL LOW (ref 22–32)
CREATININE: 1.04 mg/dL — AB (ref 0.44–1.00)
Calcium: 9.3 mg/dL (ref 8.9–10.3)
Chloride: 106 mmol/L (ref 101–111)
GFR calc Af Amer: >60 mL/min (ref >60)
GFR calc non Af Amer: 55 mL/min — ABNORMAL LOW (ref >60)
GLUCOSE: 113 mg/dL — AB (ref 65–99)
POTASSIUM: 3.7 mmol/L (ref 3.5–5.1)
SODIUM: 136 mmol/L (ref 135–145)

## 2017-06-24 LAB — CBC
HCT: 30.1 % — ABNORMAL LOW (ref 36.0–46.0)
Hemoglobin: 9.4 g/dL — ABNORMAL LOW (ref 12.0–15.0)
MCH: 28.9 pg (ref 26.0–34.0)
MCHC: 31.2 g/dL (ref 30.0–36.0)
MCV: 92.6 fL (ref 78.0–100.0)
Platelets: 279 10*3/uL (ref 150–400)
RBC: 3.25 MIL/uL — ABNORMAL LOW (ref 3.87–5.11)
RDW: 15.2 % (ref 11.5–15.5)
WBC: 6.9 10*3/uL (ref 4.0–10.5)

## 2017-06-24 LAB — PROTIME-INR
INR: 1.21
PROTHROMBIN TIME: 15.2 s (ref 11.4–15.2)

## 2017-06-26 ENCOUNTER — Other Ambulatory Visit (HOSPITAL_COMMUNITY): Payer: Medicare Other

## 2017-06-26 NOTE — Progress Notes (Signed)
Referring Physician(s): Dr Ardeth SportsmanA Hijazi  Supervising Physician: Richarda OverlieHenn, Adam  Patient Status:  [facility] IP  Chief Complaint:  5/30: Procedure: Successful placement of bilateral subphrenic drainage catheters, 17F on the left and 23F on the right.  Return of copious purulent fluid.  Samples sent for culture.     Subjective:  Up in bed and up to chair Feeling better daily Drains intact Skin clean and dry OP serous  Allergies: Patient has no known allergies.  Medications: Prior to Admission medications   Not on File     Vital Signs: BP 121/85   Pulse 72   Resp (!) 23   LMP 06/13/2017 (Approximate)   SpO2 98%   Physical Exam  Constitutional: She is oriented to person, place, and time.  Neurological: She is alert and oriented to person, place, and time.  Skin: Skin is warm and dry.  Sites are clean and dry NT no bleeding OP serous - 25 cc daily Cx NGTD  Nursing note and vitals reviewed.   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 On follow-up CT imaging she has a bilateral subdiaphragmatic complex fluid collections concerning for abscesses. She presents for attempted ultrasound-guided drainage of both the left and right subdiaphragmatic abscesses. EXAM: ABSCESS DRAINAGE x2 MEDICATIONS: The patient is currently admitted to the hospital and receiving intravenous antibiotics. The antibiotics were administered within an appropriate time frame prior to the initiation of the procedure. ANESTHESIA/SEDATION: Fentanyl 75 mcg IV; Versed 1 mg IV Moderate Sedation Time:  35 minutes The patient was continuously monitored during the procedure by the interventional radiology nurse under my direct supervision. COMPLICATIONS: None immediate. PROCEDURE: Informed written consent was obtained from the patient after a thorough discussion of the procedural risks,  benefits and alternatives. All questions were addressed. Maximal Sterile Barrier Technique was utilized including caps, mask, sterile gowns, sterile gloves, sterile drape, hand hygiene and skin antiseptic. A timeout was performed prior to the initiation of the procedure. Attention was first turned to the left upper quadrant. The left upper quadrant was interrogated with ultrasound. An ill-defined fluid collection is present in the left upper quadrant position between the spleen and diaphragm. A suitable skin entry site was selected and marked. Local anesthesia was attained by infiltration with 1% lidocaine. A small dermatotomy was made. Under real-time sonographic guidance, an 18 gauge trocar needle was carefully advanced into the fluid collection. Removal of the stylet returned foul-smelling purulent material. An Amplatz wire was advanced through the needle and coiled in the left subdiaphragmatic space. The trocar needle was removed. The skin tract was dilated to 10 [place]. A Cook 10.2 [place] all-purpose drainage catheter was then advanced over the wire and formed. Aspiration yields frankly purulent material. A sample was obtained and sent for Gram stain and culture. The drainage catheter was then flushed and connected to JP bulb suction. Attention was next turned to the right upper quadrant. The right upper quadrant was interrogated with ultrasound. A fluid collection is present in the right upper quadrant position between the dome of the liver and the diaphragm. A suitable skin entry site was selected and marked. Local anesthesia was attained by infiltration with 1% lidocaine. A small dermatotomy was made. Under real-time sonographic guidance, an 18 gauge trocar needle was carefully advanced into the fluid collection. Care was taken to avoid the pleural space. An Amplatz wire was advanced through the needle and  coiled in the subdiaphragmatic space. The trocar needle was removed and the skin tract was dilated to  10 [place]. A Cook 12 [place] all-purpose drainage catheter was then advanced over the wire and formed. Aspiration yields frankly purulent material. A sample was obtained and sent for culture. The catheter was flushed and connected to JP bulb suction. Both catheters were then secured to the skin with 0 Prolene suture and adhesive fixation devices. IMPRESSION: 1. Successful placement of a 10 French drain into the left subdiaphragmatic abscess with aspiration of copious purulent fluid. A sample was obtained and sent for culture. 2. Successful placement of a 12 French drain into the right subdiaphragmatic abscess with aspiration of copious purulent fluid. A sample was obtained and sent for culture. PLAN: 1. Maintain both drains to JP bulb suction. 2. Flush drains at least once per shift. 3. Once drain output has been minimal (no more than knee injected flushes) for at least 24 hours, consider repeat CT scan of the abdomen and pelvis with intravenous contrast prior to drain removal. Signed, Sterling Big, MD [facility] Radiology Electronically Signed   By: Malachy Moan M.D.   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 On follow-up CT imaging she has a bilateral subdiaphragmatic complex fluid collections concerning for abscesses. She presents for attempted ultrasound-guided drainage of both the left and right subdiaphragmatic abscesses. EXAM: ABSCESS DRAINAGE x2 MEDICATIONS: The patient is currently admitted to the hospital and receiving intravenous antibiotics. The antibiotics were administered within an appropriate time frame prior to the initiation of the procedure. ANESTHESIA/SEDATION: Fentanyl 75 mcg IV; Versed 1 mg IV Moderate Sedation Time:  35 minutes The patient was continuously monitored during the  procedure by the interventional radiology nurse under my direct supervision. COMPLICATIONS: None immediate. PROCEDURE: Informed written consent was obtained from the patient after a thorough discussion of the procedural risks, benefits and alternatives. All questions were addressed. Maximal Sterile Barrier Technique was utilized including caps, mask, sterile gowns, sterile gloves, sterile drape, hand hygiene and skin antiseptic. A timeout was performed prior to the initiation of the procedure. Attention was first turned to the left upper quadrant. The left upper quadrant was interrogated with ultrasound. An ill-defined fluid collection is present in the left upper quadrant position between the spleen and diaphragm. A suitable skin entry site was selected and marked. Local anesthesia was attained by infiltration with 1% lidocaine. A small dermatotomy was made. Under real-time sonographic guidance, an 18 gauge trocar needle was carefully advanced into the fluid collection. Removal of the stylet returned foul-smelling purulent material. An Amplatz wire was advanced through the needle and coiled in the left subdiaphragmatic space. The trocar needle was removed. The skin tract was dilated to 10 [place]. A Cook 10.2 [place] all-purpose drainage catheter was then advanced over the wire and formed. Aspiration yields frankly purulent material. A sample was obtained and sent for Gram stain and culture. The drainage catheter was then flushed and connected to JP bulb suction. Attention was next turned to the right upper quadrant. The right upper quadrant was interrogated with ultrasound. A fluid collection is present in the right upper quadrant position between the dome of the liver and the diaphragm. A suitable skin entry site was selected and marked. Local anesthesia was attained by infiltration with 1% lidocaine. A small dermatotomy was made. Under real-time sonographic  guidance, an 18 gauge trocar needle was carefully  advanced into the fluid collection. Care was taken to avoid the pleural space. An Amplatz wire was advanced through the needle and coiled in the subdiaphragmatic space. The trocar needle was removed and the skin tract was dilated to 10 [place]. A Cook 12 [place] all-purpose drainage catheter was then advanced over the wire and formed. Aspiration yields frankly purulent material. A sample was obtained and sent for culture. The catheter was flushed and connected to JP bulb suction. Both catheters were then secured to the skin with 0 Prolene suture and adhesive fixation devices. IMPRESSION: 1. Successful placement of a 10 French drain into the left subdiaphragmatic abscess with aspiration of copious purulent fluid. A sample was obtained and sent for culture. 2. Successful placement of a 12 French drain into the right subdiaphragmatic abscess with aspiration of copious purulent fluid. A sample was obtained and sent for culture. PLAN: 1. Maintain both drains to JP bulb suction. 2. Flush drains at least once per shift. 3. Once drain output has been minimal (no more than knee injected flushes) for at least 24 hours, consider repeat CT scan of the abdomen and pelvis with intravenous contrast prior to drain removal. Signed, Sterling Big, MD [facility] Radiology Electronically Signed   By: Malachy Moan M.D.   On: 06/22/2017 17:52    Labs:  CBC: Recent Labs    06/11/17 0743 06/16/17 1707 06/17/17 0506 06/24/17 0938  WBC 6.0 7.9 6.9 6.9  HGB 8.8* 8.9* 8.8* 9.4*  HCT 28.8* 30.4* 30.1* 30.1*  PLT 342 367 339 279    COAGS: Recent Labs    04/19/17 0737 06/06/17 0709 06/17/17 0506 06/24/17 0938  INR 1.41 1.44 1.08 1.21  APTT  --  32  --   --     BMP: Recent Labs    04/19/17 0737 06/06/17 0709 06/16/17 1707 06/24/17 0938  NA 141 140 140 136  K 3.9 4.2 3.8 3.7  CL 110 106 106 106  CO2 20* 25 26 21*  GLUCOSE 106* 99 128* 113*  BUN 27*  11 10 11   CALCIUM 9.8 8.8* 9.1 9.3  CREATININE 0.99 1.11* 1.27* 1.04*  GFRNONAA 58* 51* 43* 55*  GFRAA >60 59* 50* >60    LIVER FUNCTION TESTS: Recent Labs    04/12/17 0739 04/19/17 0737 06/06/17 0709 06/16/17 1707  BILITOT 0.4 0.5 0.6 0.5  AST 23 20 15 24   ALT 9* 12* 10* 13*  ALKPHOS 67 75 99 89  PROT 6.5 6.7 5.9* 6.5  ALBUMIN 2.2* 2.4* 2.1* 2.5*    Assessment and Plan:  Bilat subdiaphragmatic abscess drains placed 5/30 Will follow  Electronically Signed: Dianca Owensby A, PA-C 06/26/2017, 8:49 AM   I spent a total of 15 Minutes at the the patient's bedside AND on the patient's hospital floor or unit, greater than 50% of which was counseling/coordinating care for Bilat sub diaphragm abscess drains

## 2017-06-28 ENCOUNTER — Other Ambulatory Visit (HOSPITAL_COMMUNITY): Payer: Medicare Other

## 2017-06-28 ENCOUNTER — Encounter (HOSPITAL_COMMUNITY): Payer: Self-pay | Admitting: Student

## 2017-06-28 HISTORY — PX: IR THORACENTESIS ASP PLEURAL SPACE W/IMG GUIDE: IMG5380

## 2017-06-28 LAB — BASIC METABOLIC PANEL
Anion gap: 4 — ABNORMAL LOW (ref 5–15)
BUN: 12 mg/dL (ref 6–20)
CALCIUM: 9.3 mg/dL (ref 8.9–10.3)
CO2: 26 mmol/L (ref 22–32)
CREATININE: 1.03 mg/dL — AB (ref 0.44–1.00)
Chloride: 107 mmol/L (ref 101–111)
GFR calc Af Amer: >60 mL/min (ref >60)
GFR, EST NON AFRICAN AMERICAN: 55 mL/min — AB (ref >60)
GLUCOSE: 111 mg/dL — AB (ref 65–99)
Potassium: 3.9 mmol/L (ref 3.5–5.1)
Sodium: 137 mmol/L (ref 135–145)

## 2017-06-28 LAB — BLOOD GAS, ARTERIAL
ACID-BASE DEFICIT: 0.8 mmol/L (ref 0.0–2.0)
ACID-BASE DEFICIT: 0.6 mmol/L (ref 0.0–2.0)
BICARBONATE: 25.7 mmol/L (ref 20.0–28.0)
BICARBONATE: 25.9 mmol/L (ref 20.0–28.0)
O2 CONTENT: 6 L/min
O2 Content: 7 L/min
O2 SAT: 81.3 %
O2 Saturation: 87.1 %
PCO2 ART: 62.6 mmHg — AB (ref 32.0–48.0)
PCO2 ART: 62.6 mmHg — AB (ref 32.0–48.0)
PH ART: 7.237 — AB (ref 7.350–7.450)
PO2 ART: 64.3 mmHg — AB (ref 83.0–108.0)
Patient temperature: 98.6
Patient temperature: 98.6
pH, Arterial: 7.24 — ABNORMAL LOW (ref 7.350–7.450)
pO2, Arterial: 54.8 mmHg — ABNORMAL LOW (ref 83.0–108.0)

## 2017-06-28 LAB — AEROBIC/ANAEROBIC CULTURE W GRAM STAIN (SURGICAL/DEEP WOUND): Culture: NO GROWTH

## 2017-06-28 LAB — AEROBIC/ANAEROBIC CULTURE (SURGICAL/DEEP WOUND)

## 2017-06-28 MED ORDER — LIDOCAINE HCL (PF) 1 % IJ SOLN
INTRAMUSCULAR | Status: AC | PRN
  Administered 2017-06-28: 10 mL

## 2017-06-28 MED ORDER — LIDOCAINE HCL (PF) 1 % IJ SOLN
INTRAMUSCULAR | Status: AC
  Filled 2017-06-28: qty 30

## 2017-06-28 NOTE — Procedures (Addendum)
PROCEDURE SUMMARY:  Successful US guided therapeutic right thoracentesis. Yielded 900 mL of yellow fluid. Pt tolerated procedure well. No immediate complications.  Specimen was not sent for labs. CXR ordered.  Hoyt KochKacie Sue-Ellen Matthews PA-C 06/28/2017 4:24 PM

## 2017-06-29 ENCOUNTER — Other Ambulatory Visit (HOSPITAL_COMMUNITY): Payer: Medicare Other

## 2017-06-29 ENCOUNTER — Encounter: Payer: Self-pay | Admitting: Internal Medicine

## 2017-06-29 DIAGNOSIS — R6521 Severe sepsis with septic shock: Secondary | ICD-10-CM

## 2017-06-29 DIAGNOSIS — J9621 Acute and chronic respiratory failure with hypoxia: Secondary | ICD-10-CM | POA: Diagnosis not present

## 2017-06-29 DIAGNOSIS — I482 Chronic atrial fibrillation, unspecified: Secondary | ICD-10-CM | POA: Diagnosis present

## 2017-06-29 DIAGNOSIS — K651 Peritoneal abscess: Secondary | ICD-10-CM | POA: Diagnosis present

## 2017-06-29 DIAGNOSIS — J189 Pneumonia, unspecified organism: Secondary | ICD-10-CM | POA: Diagnosis not present

## 2017-06-29 DIAGNOSIS — J90 Pleural effusion, not elsewhere classified: Secondary | ICD-10-CM

## 2017-06-29 DIAGNOSIS — A419 Sepsis, unspecified organism: Secondary | ICD-10-CM

## 2017-06-29 DIAGNOSIS — I420 Dilated cardiomyopathy: Secondary | ICD-10-CM | POA: Diagnosis not present

## 2017-06-29 HISTORY — DX: Severe sepsis with septic shock: A41.9

## 2017-06-29 HISTORY — DX: Pleural effusion, not elsewhere classified: J90

## 2017-06-29 HISTORY — DX: Pneumonia, unspecified organism: J18.9

## 2017-06-29 LAB — BLOOD GAS, ARTERIAL
Acid-base deficit: 1.5 mmol/L (ref 0.0–2.0)
Bicarbonate: 22 mmol/L (ref 20.0–28.0)
FIO2: 70
MECHVT: 550 mL
O2 Saturation: 99.3 %
PEEP: 5 cmH2O
Patient temperature: 98.8
RATE: 18 resp/min
pCO2 arterial: 33.1 mmHg (ref 32.0–48.0)
pH, Arterial: 7.439 (ref 7.350–7.450)
pO2, Arterial: 162 mmHg — ABNORMAL HIGH (ref 83.0–108.0)

## 2017-06-29 LAB — CBC WITH DIFFERENTIAL/PLATELET
ABS IMMATURE GRANULOCYTES: 0.1 10*3/uL (ref 0.0–0.1)
BASOS PCT: 0 %
Basophils Absolute: 0 10*3/uL (ref 0.0–0.1)
EOS PCT: 0 %
Eosinophils Absolute: 0 10*3/uL (ref 0.0–0.7)
HCT: 32.4 % — ABNORMAL LOW (ref 36.0–46.0)
HEMOGLOBIN: 10.5 g/dL — AB (ref 12.0–15.0)
Immature Granulocytes: 1 %
Lymphocytes Relative: 27 %
Lymphs Abs: 4.2 10*3/uL — ABNORMAL HIGH (ref 0.7–4.0)
MCH: 28.7 pg (ref 26.0–34.0)
MCHC: 32.4 g/dL (ref 30.0–36.0)
MCV: 88.5 fL (ref 78.0–100.0)
MONOS PCT: 4 %
Monocytes Absolute: 0.6 10*3/uL (ref 0.1–1.0)
NEUTROS ABS: 10.7 10*3/uL — AB (ref 1.7–7.7)
Neutrophils Relative %: 68 %
PLATELETS: 400 10*3/uL (ref 150–400)
RBC: 3.66 MIL/uL — ABNORMAL LOW (ref 3.87–5.11)
RDW: 15.3 % (ref 11.5–15.5)
WBC: 15.6 10*3/uL — ABNORMAL HIGH (ref 4.0–10.5)

## 2017-06-29 LAB — BASIC METABOLIC PANEL
Anion gap: 8 (ref 5–15)
BUN: 11 mg/dL (ref 6–20)
CHLORIDE: 108 mmol/L (ref 101–111)
CO2: 20 mmol/L — ABNORMAL LOW (ref 22–32)
CREATININE: 1.21 mg/dL — AB (ref 0.44–1.00)
Calcium: 8.8 mg/dL — ABNORMAL LOW (ref 8.9–10.3)
GFR calc Af Amer: 53 mL/min — ABNORMAL LOW (ref >60)
GFR calc non Af Amer: 46 mL/min — ABNORMAL LOW (ref >60)
GLUCOSE: 113 mg/dL — AB (ref 65–99)
POTASSIUM: 4.1 mmol/L (ref 3.5–5.1)
SODIUM: 136 mmol/L (ref 135–145)

## 2017-06-29 LAB — URINALYSIS, ROUTINE W REFLEX MICROSCOPIC
Bilirubin Urine: NEGATIVE
Glucose, UA: NEGATIVE mg/dL
Ketones, ur: NEGATIVE mg/dL
Nitrite: NEGATIVE
Protein, ur: NEGATIVE mg/dL
Specific Gravity, Urine: 1.009 (ref 1.005–1.030)
pH: 5 (ref 5.0–8.0)

## 2017-06-29 LAB — LACTIC ACID, PLASMA: LACTIC ACID, VENOUS: 1.4 mmol/L (ref 0.5–1.9)

## 2017-06-29 LAB — TROPONIN I: Troponin I: 0.03 ng/mL (ref <0.03)

## 2017-06-29 LAB — MAGNESIUM: Magnesium: 1.5 mg/dL — ABNORMAL LOW (ref 1.7–2.4)

## 2017-06-29 LAB — ECHOCARDIOGRAM COMPLETE

## 2017-06-29 LAB — PHOSPHORUS: Phosphorus: 3.5 mg/dL (ref 2.5–4.6)

## 2017-06-29 MED FILL — Medication: Qty: 1 | Status: AC

## 2017-06-29 NOTE — Consult Note (Signed)
Anesthesiology Note:  Asked to intubate 67 year old female on [facility] perforated sigmoid colon and bilateral subdiaphragmatic fluid collections now with worsening respiratory failrue on BIPAP. Patient unresponsive to questions.  Pre-oxygenation with bag-mask ventilation.12 mg etomidate and 120 mg succinyl choline. DL X 1 with Miller 2 blade chords seen easily, 7.0 glottic suction ETT inserted without difficulty. Bilateral BS equal. Tube secured at 21 cm at lip. O2 Sat 100% HR-90 BP- 159/111 following intubation.   CXR shows ETT approximately 3 cm above the carina.  Kipp Broodavid Lennyn Gange

## 2017-06-29 NOTE — Consult Note (Signed)
[facility]  Date of Service: 06/29/2017  PULMONARY CONSULT   Elizabeth Rodriguez  ZOX:[id number]  DOB: 1950/11/30   DOA: 06/05/2017  Referring Physician: Carron CurieAli Hijazi, MD  HPI: Elizabeth Rodriguez is a 67 y.o. female seen for follow up of Acute on Chronic Respiratory Failure.  Patient was seen because of acute decompensation overnight.  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  Patient had intra-abdominal abscess which was treated.  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  The patient presented to our facility for rehab and management.  She was currently not on any kind of mechanical ventilation and now has had decompensation.  She has intubated overnight for increased shortness of breath and acidosis.  Blood gases that were drawn yesterday initially had shown a pH of 7.238 subsequent pH was 7.24 on nasal cannula.  Patient was intubated and the current pH is 7.43 with a PCO2 of 33 PO2 162.  Chest x-ray that was done showed low volumes.  Also there is some increased infiltrates unclear if she may have actually aspirated 2.  Review of Systems:  ROS performed and is unremarkable other than noted above.  Past Medical History:  Diagnosis Date  . Acute on chronic respiratory failure with hypoxia (HCC)   . Chronic atrial fibrillation (HCC)   . Dilated cardiomyopathy (HCC)   . Intra-abdominal abscess [facility](HCC)     Past Surgical History:  Procedure Laterality Date  . BREAST SURGERY    . COLECTOMY    . IR GUIDED DRAIN W CATHETER PLACEMENT  06/22/2017  . IR GUIDED DRAIN W CATHETER PLACEMENT  06/22/2017  . IR THORACENTESIS ASP PLEURAL SPACE W/IMG GUIDE  06/28/2017  . KNEE SURGERY    . TUBAL LIGATION      Social History:    has an unknown smoking status. She has never used smokeless tobacco. She reports that she drank alcohol. She  reports that she has current or past drug history.  Family History: Non-Contributory to the present illness  No Known Allergies  Medications: Reviewed on Rounds  Physical Exam:  Vitals: Temperature 94.2 pulse 88 respiratory rate 17 blood pressure 103/69 saturations 90%  Ventilator Settings the current mode of ventilation is assist control FiO2 40% tidal volume 558 PEEP 5  . General: Comfortable at this time . Eyes: Grossly normal lids, irises & conjunctiva . ENT: grossly tongue is normal . Neck: no obvious mass . Cardiovascular: S1-S2 normal no gallop irregular rhythm . Respiratory: Coarse breath sounds expansion is equal . Abdomen: Soft nontender . Skin: no rash seen on limited exam . Musculoskeletal: not rigid . Psychiatric:unable to assess . Neurologic: no seizure no involuntary movements         Labs on Admission:  Basic Metabolic Panel: Recent Labs  Lab 06/24/17 0938 06/28/17 0610 06/29/17 0501  NA 136 137 136  K 3.7 3.9 4.1  CL 106 107 108  CO2 21* 26 20*  GLUCOSE 113* 111* 113*  BUN 11 12 11   CREATININE 1.04* 1.03* 1.21*  CALCIUM 9.3 9.3 8.8*  MG  --   --  1.5*  PHOS  --   --  3.5    Liver Function Tests: No results for input(s): AST, ALT, ALKPHOS, BILITOT, PROT, ALBUMIN in the last 168 hours. No results for input(s): LIPASE, AMYLASE in the last 168 hours. No results for input(s): AMMONIA in  the last 168 hours.  CBC: Recent Labs  Lab 06/24/17 0938 06/29/17 0930  WBC 6.9 15.6*  NEUTROABS  --  10.7*  HGB 9.4* 10.5*  HCT 30.1* 32.4*  MCV 92.6 88.5  PLT 279 400    Cardiac Enzymes: Recent Labs  Lab 06/29/17 0501  TROPONINI 0.03*    BNP (last 3 results) No results for input(s): BNP in the last 8760 hours.  ProBNP (last 3 results) No results for input(s): PROBNP in the last 8760 hours.   Radiological Exams on Admission: Dg Chest 1 View  Result Date: 06/28/2017 CLINICAL DATA:  Right-sided Thora. Atrial fibrillation. Hypertension and  diabetes. EXAM: CHEST  1 VIEW COMPARISON:  06/26/2017 FINDINGS: Bilateral pleural drains remain in place. Patient rotated right. Moderate cardiomegaly. Prominent transverse aorta. Decrease to near complete resolution of right-sided pleural effusion. No pneumothorax. Small left pleural effusion is similar, given differences in technique. Interstitial edema is moderate, similar. Persistent left base airspace disease with significantly improved right lung base aeration. IMPRESSION: Significantly improved to resolved right-sided pleural effusion. Improved right base aeration. No pneumothorax. Overall improved aeration with persistent congestive heart failure, left pleural fluid, and bibasilar airspace disease. Electronically Signed   By: Jeronimo Greaves M.D.   On: 06/28/2017 17:31   Dg Chest Port 1 View  Result Date: 06/29/2017 CLINICAL DATA:  67 year old female with perforated sigmoid colon, bilateral subphrenic percutaneous abscess drainage. Respiratory failure, intubated. EXAM: PORTABLE CHEST 1 VIEW COMPARISON:  1154 hours today and earlier. FINDINGS: Portable AP semi upright view at 1600 hours. Endotracheal tube tip in stable position at the level the clavicles. Stable right PICC line. Continued somewhat low lung volumes with Patchy and confluent bilateral lower lung opacity. No pneumothorax. No pulmonary edema in the apices. Stable cardiac size and mediastinal contours. No acute osseous abnormality identified. IMPRESSION: 1.  Stable lines and tubes. 2. Stable ventilation with bilateral lower lung opacity which could reflect a combination of atelectasis and pneumonia. Electronically Signed   By: Odessa Fleming M.D.   On: 06/29/2017 16:14   Dg Chest Port 1 View  Result Date: 06/29/2017 CLINICAL DATA:  Line placement EXAM: PORTABLE CHEST 1 VIEW COMPARISON:  June 29, 2017 FINDINGS: A new right PICC line terminates in the SVC. The ETT is in good position. Cardiomediastinal silhouette is stable. Infiltrate in the lungs is  most marked in the mid and lower right lung in the left base. Overall, there has been mild improvement. No pneumothorax. IMPRESSION: The right PICC line and ET tube are in good position. Persistent but mildly improved bilateral infiltrates. Electronically Signed   By: Gerome Sam III M.D   On: 06/29/2017 12:03   Dg Chest Port 1 View  Result Date: 06/29/2017 CLINICAL DATA:  Endotracheal tube placement. EXAM: PORTABLE CHEST 1 VIEW COMPARISON:  Chest radiograph performed 06/28/2017 FINDINGS: The patient's endotracheal tube is seen ending 1-2 cm above the carina. This could be retracted 2 cm. Moderate right and small left pleural effusion are noted. Diffusely increased interstitial markings raise concern for pulmonary edema. No pneumothorax is seen. The cardiomediastinal silhouette is borderline normal in size. No acute osseous abnormalities are identified. A cholecystostomy tube is noted at the right upper quadrant. IMPRESSION: 1. Endotracheal tube seen ending 1-2 cm above the carina. This could be retracted 2 cm. 2. Moderate right and small left pleural effusions noted. Diffusely increased interstitial markings raise concern for pulmonary edema. Electronically Signed   By: Roanna Raider M.D.   On: 06/29/2017 02:01   Dg  Chest Port 1 View  Result Date: 06/28/2017 CLINICAL DATA:  Respiratory failure EXAM: PORTABLE CHEST 1 VIEW COMPARISON:  06/28/2017 FINDINGS: Cardiac shadow remains enlarged. Subdiaphragmatic drains are noted bilaterally and stable. Significant increase in vascular congestion and apparent interstitial edema is noted. Effusions are seen likely accentuated by patient positioning. IMPRESSION: Changes consistent with persistent congestive failure with increased vascular congestion and pulmonary edema. Increasing effusions are seen although likely related to patient positioning. Electronically Signed   By: Alcide Clever M.D.   On: 06/28/2017 21:44   Dg Chest Port 1 View  Result Date:  06/26/2017 CLINICAL DATA:  Chest pain. EXAM: PORTABLE CHEST 1 VIEW COMPARISON:  Chest radiograph 06/07/2017. Included lung bases from abdominal CT 24 FINDINGS: Stable cardiomegaly and mediastinal contours. Hazy bilateral lung base opacities consistent with pleural effusions and adjacent atelectasis, slight increase on the right from prior exam. Drainage catheters within the right and left upper quadrant of the abdomen. No pneumothorax. Similar vascular congestion to prior. IMPRESSION: Moderate bilateral pleural effusions and adjacent atelectasis, with increase on the right from prior exam. Stable cardiomegaly and vascular congestion. Electronically Signed   By: Rubye Oaks M.D.   On: 06/26/2017 19:19   Ir Thoracentesis Asp Pleural Space W/img Guide  Result Date: 06/28/2017 INDICATION: Patient with history of respiratory failure, bilateral effusions. Request is made for therapeutic thoracentesis. EXAM: ULTRASOUND GUIDED THERAPEUTIC RIGHT THORACENTESIS MEDICATIONS: 10 mL 1% lidocaine COMPLICATIONS: None immediate. PROCEDURE: An ultrasound guided thoracentesis was thoroughly discussed with the patient and questions answered. The benefits, risks, alternatives and complications were also discussed. The patient understands and wishes to proceed with the procedure. Written consent was obtained. Ultrasound was performed to localize and mark an adequate pocket of fluid in the right chest. The area was then prepped and draped in the normal sterile fashion. 1% Lidocaine was used for local anesthesia. Under ultrasound guidance a Safe-T-Centesis catheter was introduced. Thoracentesis was performed. The catheter was removed and a dressing applied. FINDINGS: A total of approximately 900 mL of yellow fluid was removed. IMPRESSION: Successful ultrasound guided therapeutic right thoracentesis yielding 900 mL of pleural fluid. Read by: Loyce Dys PA-C Electronically Signed   By: Malachy Moan M.D.   On: 06/28/2017 16:37     Assessment/Plan Active Problems:   Chronic atrial fibrillation (HCC)   Acute on chronic respiratory failure with hypoxia (HCC)   Dilated cardiomyopathy (HCC)   Intra-abdominal abscess (HCC)   Severe sepsis with septic shock (HCC)   Multifocal pneumonia   Pleural effusion   1. Acute on chronic respiratory failure with hypoxia at this time patient is on the ventilator and full support.  She is critically ill she is been requiring pressors.  Currently is on levo fed.  We need to monitor her pressures closely.  Will titrate the Levophed as tolerated.  Patient's acid-base has improved based on the intubation.  Agree with continuation of antibiotics as has been discussed on rounds with primary care team. 2. Chronic atrial fibrillation currently the rate is controlled we will continue to follow along. 3. Intra-abdominal abscess treated with antibiotics should be continued. 4. Severe sepsis with shock on Levophed as Artie mentioned.  Titrate Levophed to maintain mean arterial pressure greater than 65. 5. Probable pneumonia unclear if it may be aspiration will continue to follow along 6. Pleural effusion drained yesterday we will follow-up on the lab work and cultures  I have personally seen and evaluated the patient, evaluated laboratory and imaging results, formulated the assessment and plan and  placed orders. The Patient requires high complexity decision making for assessment and support.  Case was discussed on Rounds with the Respiratory Therapy Staff Critical care time Spent 40 minutes patient is critically ill in danger of cardiac arrest.  Total time spent in review of the medical record as well as coordination of care with the respiratory team and medical staff and primary care team.  Yevonne Pax, MD [facility] Pulmonary Critical Care Medicine Sleep Medicine

## 2017-06-29 NOTE — Progress Notes (Signed)
  Echocardiogram 2D Echocardiogram has been performed.  Roosvelt MaserLane, Rochelle Nephew F 06/29/2017, 10:04 AM

## 2017-06-30 ENCOUNTER — Other Ambulatory Visit (HOSPITAL_COMMUNITY): Payer: Medicare Other

## 2017-06-30 ENCOUNTER — Inpatient Hospital Stay (HOSPITAL_COMMUNITY)
Admission: AD | Admit: 2017-06-30 | Discharge: 2017-06-30 | Disposition: A | Discharge status: Home / Self Care | Payer: Medicare Other | Source: Ambulatory Visit | Attending: Neurology | Admitting: Neurology

## 2017-06-30 DIAGNOSIS — I482 Chronic atrial fibrillation: Secondary | ICD-10-CM | POA: Diagnosis not present

## 2017-06-30 DIAGNOSIS — R4182 Altered mental status, unspecified: Secondary | ICD-10-CM

## 2017-06-30 DIAGNOSIS — I420 Dilated cardiomyopathy: Secondary | ICD-10-CM | POA: Diagnosis not present

## 2017-06-30 DIAGNOSIS — J9621 Acute and chronic respiratory failure with hypoxia: Secondary | ICD-10-CM | POA: Diagnosis not present

## 2017-06-30 DIAGNOSIS — J189 Pneumonia, unspecified organism: Secondary | ICD-10-CM | POA: Diagnosis not present

## 2017-06-30 LAB — BASIC METABOLIC PANEL
Anion gap: 8 (ref 5–15)
BUN: 14 mg/dL (ref 6–20)
CHLORIDE: 106 mmol/L (ref 101–111)
CO2: 24 mmol/L (ref 22–32)
Calcium: 8.7 mg/dL — ABNORMAL LOW (ref 8.9–10.3)
Creatinine, Ser: 1.2 mg/dL — ABNORMAL HIGH (ref 0.44–1.00)
GFR calc Af Amer: 53 mL/min — ABNORMAL LOW (ref >60)
GFR calc non Af Amer: 46 mL/min — ABNORMAL LOW (ref >60)
GLUCOSE: 93 mg/dL (ref 65–99)
POTASSIUM: 3.1 mmol/L — AB (ref 3.5–5.1)
Sodium: 138 mmol/L (ref 135–145)

## 2017-06-30 LAB — CBC
HEMATOCRIT: 28.8 % — AB (ref 36.0–46.0)
HEMOGLOBIN: 9.4 g/dL — AB (ref 12.0–15.0)
MCH: 29.1 pg (ref 26.0–34.0)
MCHC: 32.6 g/dL (ref 30.0–36.0)
MCV: 89.2 fL (ref 78.0–100.0)
Platelets: 282 10*3/uL (ref 150–400)
RBC: 3.23 MIL/uL — ABNORMAL LOW (ref 3.87–5.11)
RDW: 15.2 % (ref 11.5–15.5)
WBC: 10.4 10*3/uL (ref 4.0–10.5)

## 2017-06-30 NOTE — Progress Notes (Signed)
Pulmonary Critical Care Medicine [facility] [place]   PULMONARY SERVICE  PROGRESS NOTE  Date of Service: 06/30/2017  Elizabeth Rodriguez  WUJ:[id number]  DOB: 1950-10-17   DOA: 06/05/2017  Referring Physician: Carron Curie, MD  HPI: Elizabeth Rodriguez is a 67 y.o. female seen for follow up of Acute on Chronic Respiratory Failure.  Patient remains on full vent support was attempted on pressure support tolerated approximately 3 hours.  Still somnolent and unresponsive.  Low-grade fever still noted.  We are trying to wean off from pressors  Medications: Reviewed on Rounds  Physical Exam:  Vitals: Temperature 99.1 pulse 81 respiratory 18 blood pressure 102/60 saturations 100%  Ventilator Settings mode of ventilation assist control FiO2 28% Vt 450cc PEEP 5  . General: Comfortable at this time . Eyes: Grossly normal lids, irises & conjunctiva . ENT: grossly tongue is normal . Neck: no obvious mass . Cardiovascular: S1 S2 normal no gallop . Respiratory: Coarse sounding breath sounds bilaterally with scattered rhonchi . Abdomen: soft . Skin: no rash seen on limited exam . Musculoskeletal: not rigid . Psychiatric:unable to assess . Neurologic: no seizure no involuntary movements         Lab Data:   Basic Metabolic Panel: Recent Labs  Lab 06/24/17 0938 06/28/17 0610 06/29/17 0501 06/30/17 1007  NA 136 137 136 138  K 3.7 3.9 4.1 3.1*  CL 106 107 108 106  CO2 21* 26 20* 24  GLUCOSE 113* 111* 113* 93  BUN 11 12 11 14   CREATININE 1.04* 1.03* 1.21* 1.20*  CALCIUM 9.3 9.3 8.8* 8.7*  MG  --   --  1.5*  --   PHOS  --   --  3.5  --     Liver Function Tests: No results for input(s): AST, ALT, ALKPHOS, BILITOT, PROT, ALBUMIN in the last 168 hours. No results for input(s): LIPASE, AMYLASE in the last 168 hours. No results for input(s): AMMONIA in the last 168 hours.  CBC: Recent Labs  Lab 06/24/17 0938 06/29/17 0930 06/30/17 1007  WBC 6.9 15.6* 10.4  NEUTROABS  --   10.7*  --   HGB 9.4* 10.5* 9.4*  HCT 30.1* 32.4* 28.8*  MCV 92.6 88.5 89.2  PLT 279 400 282    Cardiac Enzymes: Recent Labs  Lab 06/29/17 0501  TROPONINI 0.03*    BNP (last 3 results) No results for input(s): BNP in the last 8760 hours.  ProBNP (last 3 results) No results for input(s): PROBNP in the last 8760 hours.  Radiological Exams: Dg Chest 1 View  Result Date: 06/28/2017 CLINICAL DATA:  Right-sided Thora. Atrial fibrillation. Hypertension and diabetes. EXAM: CHEST  1 VIEW COMPARISON:  06/26/2017 FINDINGS: Bilateral pleural drains remain in place. Patient rotated right. Moderate cardiomegaly. Prominent transverse aorta. Decrease to near complete resolution of right-sided pleural effusion. No pneumothorax. Small left pleural effusion is similar, given differences in technique. Interstitial edema is moderate, similar. Persistent left base airspace disease with significantly improved right lung base aeration. IMPRESSION: Significantly improved to resolved right-sided pleural effusion. Improved right base aeration. No pneumothorax. Overall improved aeration with persistent congestive heart failure, left pleural fluid, and bibasilar airspace disease. Electronically Signed   By: Jeronimo Greaves M.D.   On: 06/28/2017 17:31   Dg Chest Port 1 View  Result Date: 06/29/2017 CLINICAL DATA:  67 year old female with perforated sigmoid colon, bilateral subphrenic percutaneous abscess drainage. Respiratory failure, intubated. EXAM: PORTABLE CHEST 1 VIEW COMPARISON:  1154 hours today and earlier. FINDINGS: Portable AP  semi upright view at 1600 hours. Endotracheal tube tip in stable position at the level the clavicles. Stable right PICC line. Continued somewhat low lung volumes with Patchy and confluent bilateral lower lung opacity. No pneumothorax. No pulmonary edema in the apices. Stable cardiac size and mediastinal contours. No acute osseous abnormality identified. IMPRESSION: 1.  Stable lines and  tubes. 2. Stable ventilation with bilateral lower lung opacity which could reflect a combination of atelectasis and pneumonia. Electronically Signed   By: Odessa Fleming M.D.   On: 06/29/2017 16:14   Dg Chest Port 1 View  Result Date: 06/29/2017 CLINICAL DATA:  Line placement EXAM: PORTABLE CHEST 1 VIEW COMPARISON:  June 29, 2017 FINDINGS: A new right PICC line terminates in the SVC. The ETT is in good position. Cardiomediastinal silhouette is stable. Infiltrate in the lungs is most marked in the mid and lower right lung in the left base. Overall, there has been mild improvement. No pneumothorax. IMPRESSION: The right PICC line and ET tube are in good position. Persistent but mildly improved bilateral infiltrates. Electronically Signed   By: Gerome Sam III M.D   On: 06/29/2017 12:03   Dg Chest Port 1 View  Result Date: 06/29/2017 CLINICAL DATA:  Endotracheal tube placement. EXAM: PORTABLE CHEST 1 VIEW COMPARISON:  Chest radiograph performed 06/28/2017 FINDINGS: The patient's endotracheal tube is seen ending 1-2 cm above the carina. This could be retracted 2 cm. Moderate right and small left pleural effusion are noted. Diffusely increased interstitial markings raise concern for pulmonary edema. No pneumothorax is seen. The cardiomediastinal silhouette is borderline normal in size. No acute osseous abnormalities are identified. A cholecystostomy tube is noted at the right upper quadrant. IMPRESSION: 1. Endotracheal tube seen ending 1-2 cm above the carina. This could be retracted 2 cm. 2. Moderate right and small left pleural effusions noted. Diffusely increased interstitial markings raise concern for pulmonary edema. Electronically Signed   By: Roanna Raider M.D.   On: 06/29/2017 02:01   Dg Chest Port 1 View  Result Date: 06/28/2017 CLINICAL DATA:  Respiratory failure EXAM: PORTABLE CHEST 1 VIEW COMPARISON:  06/28/2017 FINDINGS: Cardiac shadow remains enlarged. Subdiaphragmatic drains are noted bilaterally and  stable. Significant increase in vascular congestion and apparent interstitial edema is noted. Effusions are seen likely accentuated by patient positioning. IMPRESSION: Changes consistent with persistent congestive failure with increased vascular congestion and pulmonary edema. Increasing effusions are seen although likely related to patient positioning. Electronically Signed   By: Alcide Clever M.D.   On: 06/28/2017 21:44   Dg Abd Portable 1v  Result Date: 06/30/2017 CLINICAL DATA:  NG tube placement. EXAM: PORTABLE ABDOMEN - 1 VIEW COMPARISON:  None. FINDINGS: NG tube is in place with the side port and tip both projecting in the stomach. IMPRESSION: As above. Electronically Signed   By: Drusilla Kanner M.D.   On: 06/30/2017 14:48   Ir Thoracentesis Asp Pleural Space W/img Guide  Result Date: 06/28/2017 INDICATION: Patient with history of respiratory failure, bilateral effusions. Request is made for therapeutic thoracentesis. EXAM: ULTRASOUND GUIDED THERAPEUTIC RIGHT THORACENTESIS MEDICATIONS: 10 mL 1% lidocaine COMPLICATIONS: None immediate. PROCEDURE: An ultrasound guided thoracentesis was thoroughly discussed with the patient and questions answered. The benefits, risks, alternatives and complications were also discussed. The patient understands and wishes to proceed with the procedure. Written consent was obtained. Ultrasound was performed to localize and mark an adequate pocket of fluid in the right chest. The area was then prepped and draped in the normal sterile fashion. 1%  Lidocaine was used for local anesthesia. Under ultrasound guidance a Safe-T-Centesis catheter was introduced. Thoracentesis was performed. The catheter was removed and a dressing applied. FINDINGS: A total of approximately 900 mL of yellow fluid was removed. IMPRESSION: Successful ultrasound guided therapeutic right thoracentesis yielding 900 mL of pleural fluid. Read by: Loyce DysKacie Matthews PA-C Electronically Signed   By: Malachy MoanHeath   McCullough M.D.   On: 06/28/2017 16:37    Assessment/Plan Active Problems:   Chronic atrial fibrillation (HCC)   Acute on chronic respiratory failure with hypoxia (HCC)   Dilated cardiomyopathy (HCC)   Intra-abdominal abscess (HCC)   Severe sepsis with septic shock (HCC)   Multifocal pneumonia   Pleural effusion   1. Acute on chronic respiratory failure with hypoxia remains orally intubated on the ventilator.  She is a little bit less somnolent we will try to advance her wean as tolerated as noted above she did about 3 hours of pressure support 2. Sepsis with shock weaning pressors blood pressure seems to be doing little bit better. 3. Intra-abdominal abscess antibiotics will be continued 4. Multifocal pneumonia on antibiotics we will continue to monitor 5. Dilated cardiomyopathy monitor fluid status 6. Chronic atrial fibrillation rate is controlled 7. Pleural effusion thoracentesis done 900 cc of fluid sent   I have personally seen and evaluated the patient, evaluated laboratory and imaging results, formulated the assessment and plan and placed orders. The Patient requires high complexity decision making for assessment and support.  Case was discussed on Rounds with the Respiratory Therapy Staff critical care time 30 minutes patient is critically ill in danger of cardiac arrest time spent in coordination of care and counseling  Yevonne PaxSaadat A Khan, MD [facility] Pulmonary Critical Care Medicine Sleep Medicine

## 2017-06-30 NOTE — Procedures (Signed)
Date of recording 06/30/2017  Referring physician Dr. Manson PasseyBrown  Reason for the study Altered mental status  Technical Digital EEG recording using 10-20 international electrode system  Description of the recording Posterior dominant rhythm is 8-9 hz, symmetrical and reactive  Non-REM stage II sleep was not seen. No localizing, lateralizing, epileptiform features was not seen during this recording  Impression The EEG is in awake state only.

## 2017-06-30 NOTE — Progress Notes (Signed)
EEG Complete. Results pending. 

## 2017-06-30 NOTE — Progress Notes (Signed)
Referring Physician(s): Dr. Sharyon Medicus  Supervising Physician: Richarda Overlie  Patient Status:  Elizabeth Rodriguez - In-pt  Chief Complaint:  Intra-abdominal fluid collections  Subjective: Intubated overnight 6/5.  Awake and alert on vent.   Allergies: Patient has no known allergies.  Medications: Prior to Admission medications   Not on File     Vital Signs: BP 121/85   Pulse 72   Resp (!) 23   LMP 06/13/2017 (Approximate)   SpO2 98%   Physical Exam  NAD, alert, intubated Abdomen:  RUQ drain in place.  Insertion site c/d/i.  LUQ drain in place.  Insertion site c/d/i.  Small amount of mostly serous-appearing fluid in each bulb. Some purulent material is present.   Imaging: Dg Chest 1 View  Result Date: 06/28/2017 CLINICAL DATA:  Right-sided Thora. Atrial fibrillation. Hypertension and diabetes. EXAM: CHEST  1 VIEW COMPARISON:  06/26/2017 FINDINGS: Bilateral pleural drains remain in place. Patient rotated right. Moderate cardiomegaly. Prominent transverse aorta. Decrease to near complete resolution of right-sided pleural effusion. No pneumothorax. Small left pleural effusion is similar, given differences in technique. Interstitial edema is moderate, similar. Persistent left base airspace disease with significantly improved right lung base aeration. IMPRESSION: Significantly improved to resolved right-sided pleural effusion. Improved right base aeration. No pneumothorax. Overall improved aeration with persistent congestive heart failure, left pleural fluid, and bibasilar airspace disease. Electronically Signed   By: Jeronimo Greaves M.D.   On: 06/28/2017 17:31   Dg Chest Port 1 View  Result Date: 06/29/2017 CLINICAL DATA:  67 year old female with perforated sigmoid colon, bilateral subphrenic percutaneous abscess drainage. Respiratory failure, intubated. EXAM: PORTABLE CHEST 1 VIEW COMPARISON:  1154 hours today and earlier. FINDINGS: Portable AP semi upright view at 1600 hours. Endotracheal tube tip  in stable position at the level the clavicles. Stable right PICC line. Continued somewhat low lung volumes with Patchy and confluent bilateral lower lung opacity. No pneumothorax. No pulmonary edema in the apices. Stable cardiac size and mediastinal contours. No acute osseous abnormality identified. IMPRESSION: 1.  Stable lines and tubes. 2. Stable ventilation with bilateral lower lung opacity which could reflect a combination of atelectasis and pneumonia. Electronically Signed   By: Odessa Fleming M.D.   On: 06/29/2017 16:14   Dg Chest Port 1 View  Result Date: 06/29/2017 CLINICAL DATA:  Line placement EXAM: PORTABLE CHEST 1 VIEW COMPARISON:  June 29, 2017 FINDINGS: A new right PICC line terminates in the SVC. The ETT is in good position. Cardiomediastinal silhouette is stable. Infiltrate in the lungs is most marked in the mid and lower right lung in the left base. Overall, there has been mild improvement. No pneumothorax. IMPRESSION: The right PICC line and ET tube are in good position. Persistent but mildly improved bilateral infiltrates. Electronically Signed   By: Gerome Sam III M.D   On: 06/29/2017 12:03   Dg Chest Port 1 View  Result Date: 06/29/2017 CLINICAL DATA:  Endotracheal tube placement. EXAM: PORTABLE CHEST 1 VIEW COMPARISON:  Chest radiograph performed 06/28/2017 FINDINGS: The patient's endotracheal tube is seen ending 1-2 cm above the carina. This could be retracted 2 cm. Moderate right and small left pleural effusion are noted. Diffusely increased interstitial markings raise concern for pulmonary edema. No pneumothorax is seen. The cardiomediastinal silhouette is borderline normal in size. No acute osseous abnormalities are identified. A cholecystostomy tube is noted at the right upper quadrant. IMPRESSION: 1. Endotracheal tube seen ending 1-2 cm above the carina. This could be retracted 2 cm. 2. Moderate  right and small left pleural effusions noted. Diffusely increased interstitial markings  raise concern for pulmonary edema. Electronically Signed   By: Roanna RaiderJeffery  Chang M.D.   On: 06/29/2017 02:01   Dg Chest Port 1 View  Result Date: 06/28/2017 CLINICAL DATA:  Respiratory failure EXAM: PORTABLE CHEST 1 VIEW COMPARISON:  06/28/2017 FINDINGS: Cardiac shadow remains enlarged. Subdiaphragmatic drains are noted bilaterally and stable. Significant increase in vascular congestion and apparent interstitial edema is noted. Effusions are seen likely accentuated by patient positioning. IMPRESSION: Changes consistent with persistent congestive failure with increased vascular congestion and pulmonary edema. Increasing effusions are seen although likely related to patient positioning. Electronically Signed   By: Alcide CleverMark  Lukens M.D.   On: 06/28/2017 21:44   Dg Chest Port 1 View  Result Date: 06/26/2017 CLINICAL DATA:  Chest pain. EXAM: PORTABLE CHEST 1 VIEW COMPARISON:  Chest radiograph 06/07/2017. Included lung bases from abdominal CT 24 FINDINGS: Stable cardiomegaly and mediastinal contours. Hazy bilateral lung base opacities consistent with pleural effusions and adjacent atelectasis, slight increase on the right from prior exam. Drainage catheters within the right and left upper quadrant of the abdomen. No pneumothorax. Similar vascular congestion to prior. IMPRESSION: Moderate bilateral pleural effusions and adjacent atelectasis, with increase on the right from prior exam. Stable cardiomegaly and vascular congestion. Electronically Signed   By: Rubye OaksMelanie  Ehinger M.D.   On: 06/26/2017 19:19   Dg Abd Portable 1v  Result Date: 06/30/2017 CLINICAL DATA:  NG tube placement. EXAM: PORTABLE ABDOMEN - 1 VIEW COMPARISON:  None. FINDINGS: NG tube is in place with the side port and tip both projecting in the stomach. IMPRESSION: As above. Electronically Signed   By: Drusilla Kannerhomas  Dalessio M.D.   On: 06/30/2017 14:48   Ir Thoracentesis Asp Pleural Space W/img Guide  Result Date: 06/28/2017 INDICATION: Patient with history of  respiratory failure, bilateral effusions. Request is made for therapeutic thoracentesis. EXAM: ULTRASOUND GUIDED THERAPEUTIC RIGHT THORACENTESIS MEDICATIONS: 10 mL 1% lidocaine COMPLICATIONS: None immediate. PROCEDURE: An ultrasound guided thoracentesis was thoroughly discussed with the patient and questions answered. The benefits, risks, alternatives and complications were also discussed. The patient understands and wishes to proceed with the procedure. Written consent was obtained. Ultrasound was performed to localize and mark an adequate pocket of fluid in the right chest. The area was then prepped and draped in the normal sterile fashion. 1% Lidocaine was used for local anesthesia. Under ultrasound guidance a Safe-T-Centesis catheter was introduced. Thoracentesis was performed. The catheter was removed and a dressing applied. FINDINGS: A total of approximately 900 mL of yellow fluid was removed. IMPRESSION: Successful ultrasound guided therapeutic right thoracentesis yielding 900 mL of pleural fluid. Read by: Loyce DysKacie Matthews PA-C Electronically Signed   By: Malachy MoanHeath  McCullough M.D.   On: 06/28/2017 16:37    Labs:  CBC: Recent Labs    06/17/17 0506 06/24/17 0938 06/29/17 0930 06/30/17 1007  WBC 6.9 6.9 15.6* 10.4  HGB 8.8* 9.4* 10.5* 9.4*  HCT 30.1* 30.1* 32.4* 28.8*  PLT 339 279 400 282    COAGS: Recent Labs    04/19/17 0737 06/06/17 0709 06/17/17 0506 06/24/17 0938  INR 1.41 1.44 1.08 1.21  APTT  --  32  --   --     BMP: Recent Labs    06/24/17 0938 06/28/17 0610 06/29/17 0501 06/30/17 1007  NA 136 137 136 138  K 3.7 3.9 4.1 3.1*  CL 106 107 108 106  CO2 21* 26 20* 24  GLUCOSE 113* 111* 113* 93  BUN 11 12 11 14   CALCIUM 9.3 9.3 8.8* 8.7*  CREATININE 1.04* 1.03* 1.21* 1.20*  GFRNONAA 55* 55* 46* 46*  GFRAA >60 >60 53* 53*    LIVER FUNCTION TESTS: Recent Labs    04/12/17 0739 04/19/17 0737 06/06/17 0709 06/16/17 1707  BILITOT 0.4 0.5 0.6 0.5  AST 23 20 15 24     ALT 9* 12* 10* 13*  ALKPHOS 67 75 99 89  PROT 6.5 6.7 5.9* 6.5  ALBUMIN 2.2* 2.4* 2.1* 2.5*    Assessment and Plan: Intra-abdominal fluid collection s/p subphrenic drainage catheter placements into RUQ and LUQ 5/30 Patient also underwent R thoracentesis 6/3.  She was intubated 6/6 for respiratory failure.  Her subphrenic drains remains intact with combination of serous and purulent material.  Continue current management.  Continue drain care.   Electronically Signed: Hoyt Koch, PA 06/30/2017, 3:21 PM   I spent a total of 15 Minutes at the the patient's bedside AND on the patient's hospital floor or unit, greater than 50% of which was counseling/coordinating care for intra-abdominal fluid collections.

## 2017-07-01 ENCOUNTER — Other Ambulatory Visit (HOSPITAL_COMMUNITY): Payer: Medicare Other

## 2017-07-01 DIAGNOSIS — I420 Dilated cardiomyopathy: Secondary | ICD-10-CM | POA: Diagnosis not present

## 2017-07-01 DIAGNOSIS — A419 Sepsis, unspecified organism: Secondary | ICD-10-CM | POA: Diagnosis not present

## 2017-07-01 DIAGNOSIS — R6521 Severe sepsis with septic shock: Secondary | ICD-10-CM | POA: Diagnosis not present

## 2017-07-01 DIAGNOSIS — K651 Peritoneal abscess: Secondary | ICD-10-CM | POA: Diagnosis not present

## 2017-07-01 DIAGNOSIS — J189 Pneumonia, unspecified organism: Secondary | ICD-10-CM | POA: Diagnosis not present

## 2017-07-01 DIAGNOSIS — J9621 Acute and chronic respiratory failure with hypoxia: Secondary | ICD-10-CM | POA: Diagnosis not present

## 2017-07-01 DIAGNOSIS — I482 Chronic atrial fibrillation: Secondary | ICD-10-CM | POA: Diagnosis not present

## 2017-07-01 DIAGNOSIS — J90 Pleural effusion, not elsewhere classified: Secondary | ICD-10-CM | POA: Diagnosis not present

## 2017-07-01 LAB — CULTURE, RESPIRATORY: CULTURE: NORMAL

## 2017-07-01 LAB — CULTURE, RESPIRATORY W GRAM STAIN: Special Requests: NORMAL

## 2017-07-01 LAB — AEROBIC/ANAEROBIC CULTURE W GRAM STAIN (SURGICAL/DEEP WOUND)

## 2017-07-01 LAB — AEROBIC/ANAEROBIC CULTURE (SURGICAL/DEEP WOUND)

## 2017-07-01 MED ORDER — IOHEXOL 300 MG/ML  SOLN
100.0000 mL | Freq: Once | INTRAMUSCULAR | Status: AC | PRN
  Administered 2017-07-01: 100 mL via INTRAVENOUS

## 2017-07-01 NOTE — Progress Notes (Signed)
Pulmonary Critical Care Medicine [facility] [place]   PULMONARY SERVICE  PROGRESS NOTE  Date of Service: 07/01/2017  Elizabeth Rodriguez  NWG:[id number]  DOB: 1950-03-23   DOA: 06/05/2017  Referring Physician: Carron Curie, MD  HPI: Elizabeth Rodriguez is a 67 y.o. female seen for follow up of Acute on Chronic Respiratory Failure.  Patient right now is on pressure support mode has been on 28% oxygen good saturations are noted.  Patient been tolerating the wean the goal today is for about 12 hours.  The patient's daughter was in the room she was updated.  Gave her updates on patient's status currently.  She was on Levophed which is being tapered off cough today.  Right now is comfortable without distress.  She remains orally intubated with a high risk airway  Medications: Reviewed on Rounds  Physical Exam:  Vitals: Temperature 98.3 pulse 75 respiratory rate 20 blood pressure 95/62 saturations 100%  Ventilator Settings mode of ventilation pressure support FiO2 28% tidal volume 306 pressure support 12 PEEP 5  . General: Comfortable at this time . Eyes: Grossly normal lids, irises & conjunctiva . ENT: grossly tongue is normal . Neck: no obvious mass . Cardiovascular: S1 S2 normal no gallop . Respiratory: No rhonchi are noted at this time . Abdomen: soft . Skin: no rash seen on limited exam . Musculoskeletal: not rigid . Psychiatric:unable to assess . Neurologic: no seizure no involuntary movements         Lab Data:   Basic Metabolic Panel: Recent Labs  Lab 06/28/17 0610 06/29/17 0501 06/30/17 1007  NA 137 136 138  K 3.9 4.1 3.1*  CL 107 108 106  CO2 26 20* 24  GLUCOSE 111* 113* 93  BUN 12 11 14   CREATININE 1.03* 1.21* 1.20*  CALCIUM 9.3 8.8* 8.7*  MG  --  1.5*  --   PHOS  --  3.5  --     Liver Function Tests: No results for input(s): AST, ALT, ALKPHOS, BILITOT, PROT, ALBUMIN in the last 168 hours. No results for input(s): LIPASE, AMYLASE in the last 168  hours. No results for input(s): AMMONIA in the last 168 hours.  CBC: Recent Labs  Lab 06/29/17 0930 06/30/17 1007  WBC 15.6* 10.4  NEUTROABS 10.7*  --   HGB 10.5* 9.4*  HCT 32.4* 28.8*  MCV 88.5 89.2  PLT 400 282    Cardiac Enzymes: Recent Labs  Lab 06/29/17 0501  TROPONINI 0.03*    BNP (last 3 results) No results for input(s): BNP in the last 8760 hours.  ProBNP (last 3 results) No results for input(s): PROBNP in the last 8760 hours.  Radiological Exams: Ct Head Wo Contrast  Result Date: 06/30/2017 CLINICAL DATA:  Initial evaluation for acute altered mental status, unclear cause. EXAM: CT HEAD WITHOUT CONTRAST TECHNIQUE: Contiguous axial images were obtained from the base of the skull through the vertex without intravenous contrast. COMPARISON:  None available. FINDINGS: Brain: Cerebral volume within normal limits. No acute intracranial hemorrhage. No acute large vessel territory infarct. No mass lesion, midline shift or mass effect. No hydrocephalus. No extra-axial fluid collection. No made of an empty sella. Vascular: No hyperdense vessel. Scattered vascular calcifications noted within the carotid siphons. Skull: Scalp soft tissues and calvarium within normal limits. Sinuses/Orbits: Globes and orbital soft tissues unremarkable. Nasogastric tube partially visualized. Secondary layering fluid within the nasopharynx. Mild mucosal thickening within the ethmoidal air cells and maxillary sinuses. Paranasal sinuses otherwise clear. No significant mastoid effusion. Other:  None. IMPRESSION: 1. No acute intracranial abnormality. 2. Empty sella. Electronically Signed   By: Rise MuBenjamin  McClintock M.D.   On: 06/30/2017 23:59   Dg Chest Port 1 View  Result Date: 06/29/2017 CLINICAL DATA:  67 year old female with perforated sigmoid colon, bilateral subphrenic percutaneous abscess drainage. Respiratory failure, intubated. EXAM: PORTABLE CHEST 1 VIEW COMPARISON:  1154 hours today and earlier.  FINDINGS: Portable AP semi upright view at 1600 hours. Endotracheal tube tip in stable position at the level the clavicles. Stable right PICC line. Continued somewhat low lung volumes with Patchy and confluent bilateral lower lung opacity. No pneumothorax. No pulmonary edema in the apices. Stable cardiac size and mediastinal contours. No acute osseous abnormality identified. IMPRESSION: 1.  Stable lines and tubes. 2. Stable ventilation with bilateral lower lung opacity which could reflect a combination of atelectasis and pneumonia. Electronically Signed   By: Odessa FlemingH  Hall M.D.   On: 06/29/2017 16:14   Dg Abd Portable 1v  Result Date: 06/30/2017 CLINICAL DATA:  NG tube placement. EXAM: PORTABLE ABDOMEN - 1 VIEW COMPARISON:  None. FINDINGS: NG tube is in place with the side port and tip both projecting in the stomach. IMPRESSION: As above. Electronically Signed   By: Drusilla Kannerhomas  Dalessio M.D.   On: 06/30/2017 14:48    Assessment/Plan Active Problems:   Chronic atrial fibrillation (HCC)   Acute on chronic respiratory failure with hypoxia (HCC)   Dilated cardiomyopathy (HCC)   Intra-abdominal abscess (HCC)   Severe sepsis with septic shock (HCC)   Multifocal pneumonia   Pleural effusion   1. Acute on chronic respiratory failure with hypoxia we will continue with weaning on pressure support mode continue secretion management pulmonary toilet patient's overall prognosis is guarded I am hopeful that we should be able to hopefully extubate. 2. Dilated cardia myopathy prognosis guarded we will continue present management. 3. Intra-abdominal abscess treated we will continue with supportive care. 4. Sepsis with shock wean off the Levophed monitor pressures and fluid status. 5. Multifocal pneumonia treated with antibiotics 6. Pleural effusion status post thoracentesis 7. Chronic atrial fibrillation currently the rate is controlled.   I have personally seen and evaluated the patient, evaluated laboratory and  imaging results, formulated the assessment and plan and placed orders. The Patient requires high complexity decision making for assessment and support.  Case was discussed on Rounds with the Respiratory Therapy Staff time 35 minutes spent reviewing the chart discussion with primary care team consultation with the family and coordination of care  Yevonne PaxSaadat A Khan, MD [facility] Pulmonary Critical Care Medicine Sleep Medicine

## 2017-07-02 DIAGNOSIS — I482 Chronic atrial fibrillation: Secondary | ICD-10-CM | POA: Diagnosis not present

## 2017-07-02 DIAGNOSIS — I420 Dilated cardiomyopathy: Secondary | ICD-10-CM | POA: Diagnosis not present

## 2017-07-02 DIAGNOSIS — J9621 Acute and chronic respiratory failure with hypoxia: Secondary | ICD-10-CM | POA: Diagnosis not present

## 2017-07-02 DIAGNOSIS — K651 Peritoneal abscess: Secondary | ICD-10-CM | POA: Diagnosis not present

## 2017-07-02 LAB — BASIC METABOLIC PANEL
ANION GAP: 6 (ref 5–15)
BUN: 16 mg/dL (ref 6–20)
CHLORIDE: 108 mmol/L (ref 101–111)
CO2: 23 mmol/L (ref 22–32)
Calcium: 8.9 mg/dL (ref 8.9–10.3)
Creatinine, Ser: 0.81 mg/dL (ref 0.44–1.00)
GFR calc non Af Amer: >60 mL/min (ref >60)
Glucose, Bld: 109 mg/dL — ABNORMAL HIGH (ref 65–99)
Potassium: 3 mmol/L — ABNORMAL LOW (ref 3.5–5.1)
SODIUM: 137 mmol/L (ref 135–145)

## 2017-07-02 LAB — URINE CULTURE
Culture: >=100000 — AB
Special Requests: NORMAL

## 2017-07-02 LAB — TROPONIN I: Troponin I: 0.04 ng/mL (ref <0.03)

## 2017-07-02 LAB — VANCOMYCIN, TROUGH: Vancomycin Tr: 53 ug/mL (ref 15–20)

## 2017-07-02 LAB — BRAIN NATRIURETIC PEPTIDE: B Natriuretic Peptide: 178.3 pg/mL — ABNORMAL HIGH (ref 0.0–100.0)

## 2017-07-02 LAB — DIGOXIN LEVEL: Digoxin Level: <0.2 ng/mL — ABNORMAL LOW (ref 0.8–2.0)

## 2017-07-02 LAB — MAGNESIUM: Magnesium: 1.8 mg/dL (ref 1.7–2.4)

## 2017-07-02 NOTE — Consult Note (Signed)
Referring Physician:  TRACEY HERMANCE is an 67 y.o. female.                       Chief Complaint: Bradycardia  HPI: 67 year old female admitted for abdominal abscess and sepsis had acute on chronic respiratory failure with hypoxia. Patient is intubated. She is weaned down to 28 % oxygen.  She had episodes of high grade AV block. Her B-blocker and digoxin are on hold with improvement in heart rate of chronic atrial fibrillation.  Patient is awake and denies chest pain or abdominal pain and wiggles all 4 extremities on command.  Her recent echocardiogram showed mild LVH with mild systolic dysfunction with EF of 50 to 55 %. She has moderate to severely dilated LA and RA and mild MR and TR. She also had recent pleural effusion drained and had supplemental potassium for hypokalemia post diuresis.  Past Medical History:  Diagnosis Date  . Acute on chronic respiratory failure with hypoxia (HCC)   . Chronic atrial fibrillation (HCC)   . Dilated cardiomyopathy (HCC)   . Intra-abdominal abscess (HCC)   . Multifocal pneumonia 06/29/2017  . Pleural effusion 06/29/2017  . Severe sepsis with septic shock (HCC) 06/29/2017      Past Surgical History:  Procedure Laterality Date  . BREAST SURGERY    . COLECTOMY    . IR GUIDED DRAIN W CATHETER PLACEMENT  06/22/2017  . IR GUIDED DRAIN W CATHETER PLACEMENT  06/22/2017  . IR THORACENTESIS ASP PLEURAL SPACE W/IMG GUIDE  06/28/2017  . KNEE SURGERY    . TUBAL LIGATION      Family History  Family history unknown: Yes   Social History:  has an unknown smoking status. She has never used smokeless tobacco. She reports that she drank alcohol. She reports that she has current or past drug history.  Allergies: No Known Allergies  No medications prior to admission.    No results found for this or any previous visit (from the past 48 hour(s)). Ct Head Wo Contrast  Result Date: 06/30/2017 CLINICAL DATA:  Initial evaluation for acute altered mental status,  unclear cause. EXAM: CT HEAD WITHOUT CONTRAST TECHNIQUE: Contiguous axial images were obtained from the base of the skull through the vertex without intravenous contrast. COMPARISON:  None available. FINDINGS: Brain: Cerebral volume within normal limits. No acute intracranial hemorrhage. No acute large vessel territory infarct. No mass lesion, midline shift or mass effect. No hydrocephalus. No extra-axial fluid collection. No made of an empty sella. Vascular: No hyperdense vessel. Scattered vascular calcifications noted within the carotid siphons. Skull: Scalp soft tissues and calvarium within normal limits. Sinuses/Orbits: Globes and orbital soft tissues unremarkable. Nasogastric tube partially visualized. Secondary layering fluid within the nasopharynx. Mild mucosal thickening within the ethmoidal air cells and maxillary sinuses. Paranasal sinuses otherwise clear. No significant mastoid effusion. Other: None. IMPRESSION: 1. No acute intracranial abnormality. 2. Empty sella. Electronically Signed   By: Rise Mu M.D.   On: 06/30/2017 23:59   Ct Abdomen Pelvis W Contrast  Result Date: 07/01/2017 CLINICAL DATA:  Intra-abdominal abscess, follow-up EXAM: CT ABDOMEN AND PELVIS WITH CONTRAST TECHNIQUE: Multidetector CT imaging of the abdomen and pelvis was performed using the standard protocol following bolus administration of intravenous contrast. Sagittal and coronal MPR images reconstructed from axial data set. Scattered beam hardening artifacts secondary to inclusion of patient's arms in imaged field, as result of clinical condition. CONTRAST:  OMNIPAQUE IOHEXOL 300 MG/ML SOLN IV. Dilute oral  contrast. COMPARISON:  06/16/2017 FINDINGS: Lower chest: Bibasilar pleural effusions and atelectasis versus consolidation in the lower lobes. Hepatobiliary: Pigtail drainage catheter in the suprahepatic space with minimal residual fluid. Contracted gallbladder with a calcified gallstone. Liver unremarkable.  Pancreas: Unremarkable Spleen: Normal appearance adjacent drainage catheter in the LEFT subdiaphragmatic space with minimal residual fluid collection. Adrenals/Urinary Tract: Adrenal glands unremarkable. BILATERAL renal cortical atrophy. Peripelvic renal cysts LEFT kidney. Proximal LEFT ureter is minimally prominent but no ureteral calcification is seen. Bladder contains a Foley catheter as well as a small amount of urine and air. Stomach/Bowel: Normal appendix. Scattered colonic diverticulosis. 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 Nasogastric tube in stomach. Stomach and bowel loops otherwise unremarkable. Vascular/Lymphatic: Atherosclerotic calcifications aorta and iliac arteries. Minimal coronary arterial calcification. Enlargement of heart. No definite adenopathy. Reproductive: Prominent central low attenuation within uterus for age. Unremarkable adnexa. Other: Small loculated fluid collection in the pelvis question resolving postoperative hemorrhage seen on previous exam. Scattered stranding of intra-abdominal, mesenteric and retroperitoneal tissue planes, nonspecific. Scattered subcutaneous edema. No additional focal fluid collections. No hernia. Musculoskeletal: Unremarkable IMPRESSION: Limitations of exam due to beam hardening artifacts. Significantly decreased subdiaphragmatic collections bilaterally post interval placement of pigtail drainage catheters. Complex collection in the pelvis question resolving postoperative blood. Colonic diverticulosis. Cholelithiasis. Extensive soft tissue edema both within the body wall, within mesentery, and retroperitoneum, could be related to hyperproteinemia or anasarca. BILATERAL pleural effusions with bibasilar atelectasis versus infiltrate. Prominent central low attenuation within the uterus for age; recommend assessment by follow-up pelvic/transvaginal sonography after resolution of the patient's current acute process. Electronically Signed   By:  Ulyses SouthwardMark  Boles M.D.   On: 07/01/2017 18:40    Review Of Systems: Unable to obtain except as noted. Constitutional: Positive fever, chills, weight gain. Eyes: No discharge or pain. Ears: No hearing loss, No tinnitus. Respiratory: No asthma, COPD, positive pneumonias, shortness of breath. No hemoptysis. Cardiovascular: No chest pain, positive palpitation and leg edema. Gastrointestinal: H/O abdominal pain and infection Skin: No rash.   Blood pressure 121/85, pulse 72, resp. rate (!) 23, last menstrual period 06/13/2017, SpO2 98 %. There is no height or weight on file to calculate BMI. General appearance: alert, cooperative, appears stated age and mild respiratory distress Head: Normocephalic, atraumatic. Eyes: Brown eyes, pale pink conjunctiva, corneas clear.  Neck: No adenopathy, no JVD, supple, symmetrical, trachea midline and thyroid not enlarged. Resp: Clearing to auscultation bilaterally. Cardio: Irregular rate and rhythm, S1, S2 normal, III/VI systolic murmur, no click, rub or gallop GI: Soft. Extremities: 2 + edema, no cyanosis or clubbing. Skin: Warm and dry.  Neurologic: Alert and oriented X 1, Moves all 4 estremities.  Assessment/Plan Atrial fibrillation with high grade AV block, [id number] score of 4 Acute on chronic respiratory failure with hypoxemia Intra abdominal abscess Pneumonia Bilateral pleural effusion S/P sepsis Anemia of chronic disease R/O digitalis toxicity Severe hypoalbuminemia Acute on chronic renal failure  Agree with holding lanoxin. Resume small dose short acting metoprolol if heart rate over 90/min. Continue small dose IV lasix as tolerated. May need 25 % albumin to improve diuresis and blood pressure/oncotic pressure. Supplement potassium and magnesium as needed.   Ricki RodriguezAjay S Armine Rizzolo, MD  07/02/2017, 5:49 PM

## 2017-07-02 NOTE — Progress Notes (Signed)
Pulmonary Critical Care Medicine [facility] [place]   PULMONARY SERVICE  PROGRESS NOTE  Date of Service: 07/02/2017  Elizabeth DunkerSallie B Gaumer  ZOX:[id number]  DOB: 04-30-1950   DOA: 06/05/2017  Referring Physician: Carron CurieAli Hijazi, MD  HPI: Elizabeth Rodriguez is a 67 y.o. female seen for follow up of Acute on Chronic Respiratory Failure.  Patient right now is on pressure support with a goal of about 16 hours  Medications: Reviewed on Rounds  Physical Exam:  Vitals: Temperature 97.6 pulse 74 respiratory rate is 19 blood pressure 87/55 saturations 100%  Ventilator Settings mode of ventilation pressure support FiO2 28% tidal volume 399 pressure support 12/5  . General: Comfortable at this time . Eyes: Grossly normal lids, irises & conjunctiva . ENT: grossly tongue is normal . Neck: no obvious mass . Cardiovascular: S1 S2 normal no gallop . Respiratory: No rhonchi are noted at this time . Abdomen: soft . Skin: no rash seen on limited exam . Musculoskeletal: not rigid . Psychiatric:unable to assess . Neurologic: no seizure no involuntary movements         Lab Data:   Basic Metabolic Panel: Recent Labs  Lab 06/28/17 0610 06/29/17 0501 06/30/17 1007  NA 137 136 138  K 3.9 4.1 3.1*  CL 107 108 106  CO2 26 20* 24  GLUCOSE 111* 113* 93  BUN 12 11 14   CREATININE 1.03* 1.21* 1.20*  CALCIUM 9.3 8.8* 8.7*  MG  --  1.5*  --   PHOS  --  3.5  --     Liver Function Tests: No results for input(s): AST, ALT, ALKPHOS, BILITOT, PROT, ALBUMIN in the last 168 hours. No results for input(s): LIPASE, AMYLASE in the last 168 hours. No results for input(s): AMMONIA in the last 168 hours.  CBC: Recent Labs  Lab 06/29/17 0930 06/30/17 1007  WBC 15.6* 10.4  NEUTROABS 10.7*  --   HGB 10.5* 9.4*  HCT 32.4* 28.8*  MCV 88.5 89.2  PLT 400 282    Cardiac Enzymes: Recent Labs  Lab 06/29/17 0501  TROPONINI 0.03*    BNP (last 3 results) No results for input(s): BNP in the last  8760 hours.  ProBNP (last 3 results) No results for input(s): PROBNP in the last 8760 hours.  Radiological Exams: Ct Head Wo Contrast  Result Date: 06/30/2017 CLINICAL DATA:  Initial evaluation for acute altered mental status, unclear cause. EXAM: CT HEAD WITHOUT CONTRAST TECHNIQUE: Contiguous axial images were obtained from the base of the skull through the vertex without intravenous contrast. COMPARISON:  None available. FINDINGS: Brain: Cerebral volume within normal limits. No acute intracranial hemorrhage. No acute large vessel territory infarct. No mass lesion, midline shift or mass effect. No hydrocephalus. No extra-axial fluid collection. No made of an empty sella. Vascular: No hyperdense vessel. Scattered vascular calcifications noted within the carotid siphons. Skull: Scalp soft tissues and calvarium within normal limits. Sinuses/Orbits: Globes and orbital soft tissues unremarkable. Nasogastric tube partially visualized. Secondary layering fluid within the nasopharynx. Mild mucosal thickening within the ethmoidal air cells and maxillary sinuses. Paranasal sinuses otherwise clear. No significant mastoid effusion. Other: None. IMPRESSION: 1. No acute intracranial abnormality. 2. Empty sella. Electronically Signed   By: Rise MuBenjamin  McClintock M.D.   On: 06/30/2017 23:59   Ct Abdomen Pelvis W Contrast  Result Date: 07/01/2017 CLINICAL DATA:  Intra-abdominal abscess, follow-up EXAM: CT ABDOMEN AND PELVIS WITH CONTRAST TECHNIQUE: Multidetector CT imaging of the abdomen and pelvis was performed using the standard protocol following bolus administration  of intravenous contrast. Sagittal and coronal MPR images reconstructed from axial data set. Scattered beam hardening artifacts secondary to inclusion of patient's arms in imaged field, as result of clinical condition. CONTRAST:  OMNIPAQUE IOHEXOL 300 MG/ML SOLN IV. Dilute oral contrast. COMPARISON:  06/16/2017 FINDINGS: Lower chest: Bibasilar pleural  effusions and atelectasis versus consolidation in the lower lobes. Hepatobiliary: Pigtail drainage catheter in the suprahepatic space with minimal residual fluid. Contracted gallbladder with a calcified gallstone. Liver unremarkable. Pancreas: Unremarkable Spleen: Normal appearance adjacent drainage catheter in the LEFT subdiaphragmatic space with minimal residual fluid collection. Adrenals/Urinary Tract: Adrenal glands unremarkable. BILATERAL renal cortical atrophy. Peripelvic renal cysts LEFT kidney. Proximal LEFT ureter is minimally prominent but no ureteral calcification is seen. Bladder contains a Foley catheter as well as a small amount of urine and air. Stomach/Bowel: Normal appendix. Scattered colonic diverticulosis. 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 Nasogastric tube in stomach. Stomach and bowel loops otherwise unremarkable. Vascular/Lymphatic: Atherosclerotic calcifications aorta and iliac arteries. Minimal coronary arterial calcification. Enlargement of heart. No definite adenopathy. Reproductive: Prominent central low attenuation within uterus for age. Unremarkable adnexa. Other: Small loculated fluid collection in the pelvis question resolving postoperative hemorrhage seen on previous exam. Scattered stranding of intra-abdominal, mesenteric and retroperitoneal tissue planes, nonspecific. Scattered subcutaneous edema. No additional focal fluid collections. No hernia. Musculoskeletal: Unremarkable IMPRESSION: Limitations of exam due to beam hardening artifacts. Significantly decreased subdiaphragmatic collections bilaterally post interval placement of pigtail drainage catheters. Complex collection in the pelvis question resolving postoperative blood. Colonic diverticulosis. Cholelithiasis. Extensive soft tissue edema both within the body wall, within mesentery, and retroperitoneum, could be related to hyperproteinemia or anasarca. BILATERAL pleural effusions with bibasilar atelectasis  versus infiltrate. Prominent central low attenuation within the uterus for age; recommend assessment by follow-up pelvic/transvaginal sonography after resolution of the patient's current acute process. Electronically Signed   By: Ulyses Southward M.D.   On: 07/01/2017 18:40    Assessment/Plan Active Problems:   Chronic atrial fibrillation (HCC)   Acute on chronic respiratory failure with hypoxia (HCC)   Dilated cardiomyopathy (HCC)   Intra-abdominal abscess (HCC)   Severe sepsis with septic shock (HCC)   Multifocal pneumonia   Pleural effusion   1. Acute on chronic respiratory failure with hypoxia patient will be tried for 16 hours on pressure support as tolerated.  Continue with pulmonary toilet secretion management.  Need to monitor closely.  Right now volumes are acceptable on pressure support of 12/5 2. Sepsis with shock clinically resolved we will monitor 3. Multifocal pneumonia treated with antibiotics 4. Intra-abdominal abscess treated with antibiotics we will continue to follow 5. Chronic atrial fibrillation the rate is controlled 6. Dilated cardiomyopathy prognosis is guarded we will continue to follow   I have personally seen and evaluated the patient, evaluated laboratory and imaging results, formulated the assessment and plan and placed orders. The Patient requires high complexity decision making for assessment and support.  Case was discussed on Rounds with the Respiratory Therapy Staff  Yevonne Pax, MD [facility] Pulmonary Critical Care Medicine Sleep Medicine

## 2017-07-03 DIAGNOSIS — I420 Dilated cardiomyopathy: Secondary | ICD-10-CM | POA: Diagnosis not present

## 2017-07-03 DIAGNOSIS — J9621 Acute and chronic respiratory failure with hypoxia: Secondary | ICD-10-CM | POA: Diagnosis not present

## 2017-07-03 DIAGNOSIS — I482 Chronic atrial fibrillation: Secondary | ICD-10-CM | POA: Diagnosis not present

## 2017-07-03 DIAGNOSIS — J189 Pneumonia, unspecified organism: Secondary | ICD-10-CM | POA: Diagnosis not present

## 2017-07-03 LAB — BASIC METABOLIC PANEL
Anion gap: 7 (ref 5–15)
BUN: 18 mg/dL (ref 6–20)
CHLORIDE: 108 mmol/L (ref 101–111)
CO2: 22 mmol/L (ref 22–32)
CREATININE: 0.82 mg/dL (ref 0.44–1.00)
Calcium: 8.8 mg/dL — ABNORMAL LOW (ref 8.9–10.3)
GFR calc non Af Amer: >60 mL/min (ref >60)
Glucose, Bld: 90 mg/dL (ref 65–99)
Potassium: 2.7 mmol/L — CL (ref 3.5–5.1)
Sodium: 137 mmol/L (ref 135–145)

## 2017-07-03 LAB — TROPONIN I
Troponin I: 0.04 ng/mL (ref <0.03)
Troponin I: 0.03 ng/mL (ref <0.03)

## 2017-07-03 LAB — T4, FREE: Free T4: 1.59 ng/dL (ref 0.82–1.77)

## 2017-07-03 LAB — TSH: TSH: 3.675 u[IU]/mL (ref 0.350–4.500)

## 2017-07-03 NOTE — Progress Notes (Signed)
Referring Physician(s): Hijazi  Supervising Physician: Irish LackYamagata, Glenn  Patient Status:  [facility]  Chief Complaint:  Intra-abdominal fluid collections  Subjective:  Awake and alert Intubated, follows commands  Family at bedside.  Dr. Lowella DandyHenn had reviewed images yesterday and recommended removal of both drains if output remained minimal.  Allergies: Patient has no known allergies.  Medications: Prior to Admission medications   Not on File     Vital Signs: BP 121/85   Pulse 72   Resp (!) 23   LMP 06/13/2017 (Approximate)   SpO2 98%   Physical Exam Awake and alert Intubated, trying to wean vent LUQ and RUQ drains with minimal clear output, appears to be mostly flush. RUQ output ~ 20 mL clear drainage LUQ output ~ 35 mL clear drainage This appears to be from over a couple of days as the patient states the drains have not been emptied in a day or two.  Imaging: Ct Head Wo Contrast  Result Date: 06/30/2017 CLINICAL DATA:  Initial evaluation for acute altered mental status, unclear cause. EXAM: CT HEAD WITHOUT CONTRAST TECHNIQUE: Contiguous axial images were obtained from the base of the skull through the vertex without intravenous contrast. COMPARISON:  None available. FINDINGS: Brain: Cerebral volume within normal limits. No acute intracranial hemorrhage. No acute large vessel territory infarct. No mass lesion, midline shift or mass effect. No hydrocephalus. No extra-axial fluid collection. No made of an empty sella. Vascular: No hyperdense vessel. Scattered vascular calcifications noted within the carotid siphons. Skull: Scalp soft tissues and calvarium within normal limits. Sinuses/Orbits: Globes and orbital soft tissues unremarkable. Nasogastric tube partially visualized. Secondary layering fluid within the nasopharynx. Mild mucosal thickening within the ethmoidal air cells and maxillary sinuses. Paranasal sinuses otherwise clear. No significant mastoid effusion.  Other: None. IMPRESSION: 1. No acute intracranial abnormality. 2. Empty sella. Electronically Signed   By: Rise MuBenjamin  McClintock M.D.   On: 06/30/2017 23:59   Ct Abdomen Pelvis W Contrast  Result Date: 07/01/2017 CLINICAL DATA:  Intra-abdominal abscess, follow-up EXAM: CT ABDOMEN AND PELVIS WITH CONTRAST TECHNIQUE: Multidetector CT imaging of the abdomen and pelvis was performed using the standard protocol following bolus administration of intravenous contrast. Sagittal and coronal MPR images reconstructed from axial data set. Scattered beam hardening artifacts secondary to inclusion of patient's arms in imaged field, as result of clinical condition. CONTRAST:  100mL OMNIPAQUE IOHEXOL 300 MG/ML SOLN IV. Dilute oral contrast. COMPARISON:  06/16/2017 FINDINGS: Lower chest: Bibasilar pleural effusions and atelectasis versus consolidation in the lower lobes. Hepatobiliary: Pigtail drainage catheter in the suprahepatic space with minimal residual fluid. Contracted gallbladder with a calcified gallstone. Liver unremarkable. Pancreas: Unremarkable Spleen: Normal appearance adjacent drainage catheter in the LEFT subdiaphragmatic space with minimal residual fluid collection. Adrenals/Urinary Tract: Adrenal glands unremarkable. BILATERAL renal cortical atrophy. Peripelvic renal cysts LEFT kidney. Proximal LEFT ureter is minimally prominent but no ureteral calcification is seen. Bladder contains a Foley catheter as well as a small amount of urine and air. Stomach/Bowel: Normal appendix. Scattered colonic diverticulosis. 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 Nasogastric tube in stomach. Stomach and bowel loops otherwise unremarkable. Vascular/Lymphatic: Atherosclerotic calcifications aorta and iliac arteries. Minimal coronary arterial calcification. Enlargement of heart. No definite adenopathy. Reproductive: Prominent central low attenuation within uterus for age. Unremarkable adnexa. Other: Small loculated  fluid collection in the pelvis question resolving postoperative hemorrhage seen on previous exam. Scattered stranding of intra-abdominal, mesenteric and retroperitoneal tissue planes, nonspecific. Scattered subcutaneous edema. No additional focal fluid collections. No hernia.  Musculoskeletal: Unremarkable IMPRESSION: Limitations of exam due to beam hardening artifacts. Significantly decreased subdiaphragmatic collections bilaterally post interval placement of pigtail drainage catheters. Complex collection in the pelvis question resolving postoperative blood. Colonic diverticulosis. Cholelithiasis. Extensive soft tissue edema both within the body wall, within mesentery, and retroperitoneum, could be related to hyperproteinemia or anasarca. BILATERAL pleural effusions with bibasilar atelectasis versus infiltrate. Prominent central low attenuation within the uterus for age; recommend assessment by follow-up pelvic/transvaginal sonography after resolution of the patient's current acute process. Electronically Signed   By: Ulyses Southward M.D.   On: 07/01/2017 18:40   Dg Chest Port 1 View  Result Date: 06/29/2017 CLINICAL DATA:  67 year old female with perforated sigmoid colon, bilateral subphrenic percutaneous abscess drainage. Respiratory failure, intubated. EXAM: PORTABLE CHEST 1 VIEW COMPARISON:  1154 hours today and earlier. FINDINGS: Portable AP semi upright view at 1600 hours. Endotracheal tube tip in stable position at the level the clavicles. Stable right PICC line. Continued somewhat low lung volumes with Patchy and confluent bilateral lower lung opacity. No pneumothorax. No pulmonary edema in the apices. Stable cardiac size and mediastinal contours. No acute osseous abnormality identified. IMPRESSION: 1.  Stable lines and tubes. 2. Stable ventilation with bilateral lower lung opacity which could reflect a combination of atelectasis and pneumonia. Electronically Signed   By: Odessa Fleming M.D.   On: 06/29/2017 16:14     Dg Abd Portable 1v  Result Date: 06/30/2017 CLINICAL DATA:  NG tube placement. EXAM: PORTABLE ABDOMEN - 1 VIEW COMPARISON:  None. FINDINGS: NG tube is in place with the side port and tip both projecting in the stomach. IMPRESSION: As above. Electronically Signed   By: Drusilla Kanner M.D.   On: 06/30/2017 14:48    Labs:  CBC: Recent Labs    06/17/17 0506 06/24/17 0938 06/29/17 0930 06/30/17 1007  WBC 6.9 6.9 15.6* 10.4  HGB 8.8* 9.4* 10.5* 9.4*  HCT 30.1* 30.1* 32.4* 28.8*  PLT 339 279 400 282    COAGS: Recent Labs    04/19/17 0737 06/06/17 0709 06/17/17 0506 06/24/17 0938  INR 1.41 1.44 1.08 1.21  APTT  --  32  --   --     BMP: Recent Labs    06/29/17 0501 06/30/17 1007 07/02/17 1723 07/03/17 0628  NA 136 138 137 137  K 4.1 3.1* 3.0* 2.7*  CL 108 106 108 108  CO2 20* 24 23 22   GLUCOSE 113* 93 109* 90  BUN 11 14 16 18   CALCIUM 8.8* 8.7* 8.9 8.8*  CREATININE 1.21* 1.20* 0.81 0.82  GFRNONAA 46* 46* >60 >60  GFRAA 53* 53* >60 >60    LIVER FUNCTION TESTS: Recent Labs    04/12/17 0739 04/19/17 0737 06/06/17 0709 06/16/17 1707  BILITOT 0.4 0.5 0.6 0.5  AST 23 20 15 24   ALT 9* 12* 10* 13*  ALKPHOS 67 75 99 89  PROT 6.5 6.7 5.9* 6.5  ALBUMIN 2.2* 2.4* 2.1* 2.5*    Assessment and Plan:  CT scan reviewed by Dr. Lowella Dandy yesterday.  Output appears to be about 35 mL for at least 48 hours total in the LUQ. All output is clear and appears to be flush.  Both drains removed without difficulty and dry dressings placed.  Electronically Signed: Gwynneth Macleod, PA-C 07/03/2017, 2:22 PM    I spent a total of 15 Minutes at the the patient's bedside AND on the patient's hospital floor or unit, greater than 50% of which was counseling/coordinating care for drain removal.

## 2017-07-03 NOTE — Progress Notes (Signed)
[facility]   PULMONARY SERVICE  PROGRESS NOTE  Date of Service: 07/03/2017  Elizabeth Rodriguez  VOZ:[id number]  DOB: April 16, 1950   DOA: 06/05/2017  Referring Physician: Carron CurieAli Hijazi, MD  HPI: Elizabeth Rodriguez is a 67 y.o. female seen for follow up of Acute on Chronic Respiratory Failure.  Currently patient is on pressure support mode good saturations are noted patient is comfortable  Medications: Reviewed on Rounds  Physical Exam:  Vitals: Temperature 97.4 pulse 80 respiratory rate 22 blood pressure 150/66 saturations 100%  Ventilator Settings mode of ventilation pressure support FiO2 20% per support 12/5 tidal volume 375cc  . General: Comfortable at this time . Eyes: Grossly normal lids, irises & conjunctiva . ENT: grossly tongue is normal . Neck: no obvious mass . Cardiovascular: S1 S2 normal no gallop . Respiratory: No rhonchi expansion is equal . Abdomen: soft . Skin: no rash seen on limited exam . Musculoskeletal: not rigid . Psychiatric:unable to assess . Neurologic: no seizure no involuntary movements         Lab Data:   Basic Metabolic Panel: Recent Labs  Lab 06/28/17 0610 06/29/17 0501 06/30/17 1007 07/02/17 1723 07/03/17 0628  NA 137 136 138 137 137  K 3.9 4.1 3.1* 3.0* 2.7*  CL 107 108 106 108 108  CO2 26 20* 24 23 22   GLUCOSE 111* 113* 93 109* 90  BUN 12 11 14 16 18   CREATININE 1.03* 1.21* 1.20* 0.81 0.82  CALCIUM 9.3 8.8* 8.7* 8.9 8.8*  MG  --  1.5*  --  1.8  --   PHOS  --  3.5  --   --   --     Liver Function Tests: No results for input(s): AST, ALT, ALKPHOS, BILITOT, PROT, ALBUMIN in the last 168 hours. No results for input(s): LIPASE, AMYLASE in the last 168 hours. No results for input(s): AMMONIA in the last 168 hours.  CBC: Recent Labs  Lab 06/29/17 0930 06/30/17 1007  WBC 15.6* 10.4  NEUTROABS 10.7*  --   HGB 10.5* 9.4*  HCT 32.4* 28.8*  MCV 88.5 89.2  PLT 400 282     Cardiac Enzymes: Recent Labs  Lab 06/29/17 0501 07/02/17 1723 07/03/17 0004 07/03/17 0628  TROPONINI 0.03* 0.04* 0.04* 0.03*    BNP (last 3 results) Recent Labs    07/02/17 1723  BNP 178.3*    ProBNP (last 3 results) No results for input(s): PROBNP in the last 8760 hours.  Radiological Exams: Ct Abdomen Pelvis W Contrast  Result Date: 07/01/2017 CLINICAL DATA:  Intra-abdominal abscess, follow-up EXAM: CT ABDOMEN AND PELVIS WITH CONTRAST TECHNIQUE: Multidetector CT imaging of the abdomen and pelvis was performed using the standard protocol following bolus administration of intravenous contrast. Sagittal and coronal MPR images reconstructed from axial data set. Scattered beam hardening artifacts secondary to inclusion of patient's arms in imaged field, as result of clinical condition. CONTRAST:  100mL OMNIPAQUE IOHEXOL 300 MG/ML SOLN IV. Dilute oral contrast. COMPARISON:  06/16/2017 FINDINGS: Lower chest: Bibasilar pleural effusions and atelectasis versus consolidation in the lower lobes. Hepatobiliary: Pigtail drainage catheter in the suprahepatic space with minimal residual fluid. Contracted gallbladder with a calcified gallstone. Liver unremarkable. Pancreas: Unremarkable Spleen: Normal appearance adjacent drainage catheter in the LEFT subdiaphragmatic space with minimal residual fluid collection. Adrenals/Urinary Tract: Adrenal glands unremarkable. BILATERAL renal cortical atrophy. Peripelvic renal cysts LEFT kidney. Proximal LEFT ureter is minimally prominent but no ureteral calcification is seen. Bladder contains a Foley catheter as  well as a small amount of urine and air. Stomach/Bowel: Normal appendix. Scattered colonic diverticulosis. 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 Nasogastric tube in stomach. Stomach and bowel loops otherwise unremarkable. Vascular/Lymphatic: Atherosclerotic calcifications aorta and iliac arteries. Minimal coronary arterial calcification.  Enlargement of heart. No definite adenopathy. Reproductive: Prominent central low attenuation within uterus for age. Unremarkable adnexa. Other: Small loculated fluid collection in the pelvis question resolving postoperative hemorrhage seen on previous exam. Scattered stranding of intra-abdominal, mesenteric and retroperitoneal tissue planes, nonspecific. Scattered subcutaneous edema. No additional focal fluid collections. No hernia. Musculoskeletal: Unremarkable IMPRESSION: Limitations of exam due to beam hardening artifacts. Significantly decreased subdiaphragmatic collections bilaterally post interval placement of pigtail drainage catheters. Complex collection in the pelvis question resolving postoperative blood. Colonic diverticulosis. Cholelithiasis. Extensive soft tissue edema both within the body wall, within mesentery, and retroperitoneum, could be related to hyperproteinemia or anasarca. BILATERAL pleural effusions with bibasilar atelectasis versus infiltrate. Prominent central low attenuation within the uterus for age; recommend assessment by follow-up pelvic/transvaginal sonography after resolution of the patient's current acute process. Electronically Signed   By: Ulyses Southward M.D.   On: 07/01/2017 18:40    Assessment/Plan Active Problems:   Chronic atrial fibrillation (HCC)   Acute on chronic respiratory failure with hypoxia (HCC)   Dilated cardiomyopathy (HCC)   Intra-abdominal abscess (HCC)   Severe sepsis with septic shock (HCC)   Multifocal pneumonia   Pleural effusion   1. Acute on chronic respiratory failure with hypoxia we will continue with pressure support mode continue pulmonary toilet supportive care right now she is weaning and hopeful that we should be able to give her a trial of extubation. 2. Chronic atrial fibrillation rate is controlled 3. Dilated cardiomyopathy at baseline 4. Severe sepsis with shock clinically improved on antibiotics 5. Multifocal pneumonia  treated   I have personally seen and evaluated the patient, evaluated laboratory and imaging results, formulated the assessment and plan and placed orders. The Patient requires high complexity decision making for assessment and support.  Case was discussed on Rounds with the Respiratory Therapy Staff  Yevonne Pax, MD [facility] Pulmonary Critical Care Medicine Sleep Medicine

## 2017-07-03 NOTE — Consult Note (Signed)
Ref: Shayne AlkenBrown, Stephanie Delores, MD   Subjective:  Awake. Heart rate in 80-110 range with atrial fibrillation. No chest pain. Potassium down to 2.7. Dig toxicity ruled out.  Objective:  Vital Signs in the last 24 hours:    Physical Exam: BP Readings from Last 1 Encounters:  06/22/17 121/85     Wt Readings from Last 1 Encounters:  No data found for Wt    Weight change:  There is no height or weight on file to calculate BMI. HEENT: [place]/AT, Eyes-Brown, PERL, EOMI, Conjunctiva-Pale pink, Sclera-Non-icteric Neck: No JVD, No bruit, Trachea midline. Lungs:  Clearing, Bilateral. Cardiac:  Regular rhythm, normal S1 and S2, no S3. II/VI systolic murmur. Abdomen:  Soft, non-tender. BS present. Extremities:  2 + edema present. No cyanosis. No clubbing. CNS: AxOx1, Cranial nerves grossly intact, moves all 4 extremities.  Skin: Warm and dry.   Intake/Output from previous day: No intake/output data recorded.    Lab Results: BMET    Component Value Date/Time   NA 137 07/03/2017 0628   NA 137 07/02/2017 1723   NA 138 06/30/2017 1007   K 2.7 (LL) 07/03/2017 0628   K 3.0 (L) 07/02/2017 1723   K 3.1 (L) 06/30/2017 1007   CL 108 07/03/2017 0628   CL 108 07/02/2017 1723   CL 106 06/30/2017 1007   CO2 22 07/03/2017 0628   CO2 23 07/02/2017 1723   CO2 24 06/30/2017 1007   GLUCOSE 90 07/03/2017 0628   GLUCOSE 109 (H) 07/02/2017 1723   GLUCOSE 93 06/30/2017 1007   BUN 18 07/03/2017 0628   BUN 16 07/02/2017 1723   BUN 14 06/30/2017 1007   CREATININE 0.82 07/03/2017 0628   CREATININE 0.81 07/02/2017 1723   CREATININE 1.20 (H) 06/30/2017 1007   CALCIUM 8.8 (L) 07/03/2017 0628   CALCIUM 8.9 07/02/2017 1723   CALCIUM 8.7 (L) 06/30/2017 1007   GFRNONAA >60 07/03/2017 0628   GFRNONAA >60 07/02/2017 1723   GFRNONAA 46 (L) 06/30/2017 1007   GFRAA >60 07/03/2017 0628   GFRAA >60 07/02/2017 1723   GFRAA 53 (L) 06/30/2017 1007   CBC    Component Value Date/Time   WBC 10.4 06/30/2017 1007    RBC 3.23 (L) 06/30/2017 1007   HGB 9.4 (L) 06/30/2017 1007   HCT 28.8 (L) 06/30/2017 1007   PLT 282 06/30/2017 1007   MCV 89.2 06/30/2017 1007   MCH 29.1 06/30/2017 1007   MCHC 32.6 06/30/2017 1007   RDW 15.2 06/30/2017 1007   LYMPHSABS 4.2 (H) 06/29/2017 0930   MONOABS 0.6 06/29/2017 0930   EOSABS 0.0 06/29/2017 0930   BASOSABS 0.0 06/29/2017 0930   HEPATIC Function Panel Recent Labs    04/19/17 0737 06/06/17 0709 06/16/17 1707  PROT 6.7 5.9* 6.5   HEMOGLOBIN A1C No components found for: HGA1C,  MPG CARDIAC ENZYMES Lab Results  Component Value Date   TROPONINI 0.03 (HH) 07/03/2017   TROPONINI 0.04 (HH) 07/03/2017   TROPONINI 0.04 (HH) 07/02/2017   BNP No results for input(s): PROBNP in the last 8760 hours. TSH Recent Labs    07/03/17 0004  TSH 3.675   CHOLESTEROL No results for input(s): CHOL in the last 8760 hours.  Scheduled Meds: . sodium chloride flush  5 mL Intracatheter Q8H   Continuous Infusions: PRN Meds:.  Assessment/Plan: Atrial fibrillation, chronic Acute on chronic respiratory failure with hypoxemia Intra-abdominal abscess Pneumonia Bilateral pleural effusion S/P sepsis Anemia of chronic disease Hypokalemia Severe hypoalbuminemia Acute on chronic renal failure  Resume metoprolol  tartrate at 25 mg. Twice daily. Resume lasix post potassium supplement.   LOS: 0 days    Orpah Cobb  MD  07/03/2017, 9:42 AM

## 2017-07-04 DIAGNOSIS — I420 Dilated cardiomyopathy: Secondary | ICD-10-CM | POA: Diagnosis not present

## 2017-07-04 DIAGNOSIS — J189 Pneumonia, unspecified organism: Secondary | ICD-10-CM | POA: Diagnosis not present

## 2017-07-04 DIAGNOSIS — J9621 Acute and chronic respiratory failure with hypoxia: Secondary | ICD-10-CM | POA: Diagnosis not present

## 2017-07-04 DIAGNOSIS — I482 Chronic atrial fibrillation: Secondary | ICD-10-CM | POA: Diagnosis not present

## 2017-07-04 LAB — BASIC METABOLIC PANEL
ANION GAP: 8 (ref 5–15)
BUN: 19 mg/dL (ref 6–20)
CHLORIDE: 105 mmol/L (ref 101–111)
CO2: 23 mmol/L (ref 22–32)
Calcium: 9.2 mg/dL (ref 8.9–10.3)
Creatinine, Ser: 0.82 mg/dL (ref 0.44–1.00)
GFR calc Af Amer: >60 mL/min (ref >60)
GLUCOSE: 105 mg/dL — AB (ref 65–99)
Potassium: 2.7 mmol/L — CL (ref 3.5–5.1)
SODIUM: 136 mmol/L (ref 135–145)

## 2017-07-04 LAB — CULTURE, BLOOD (ROUTINE X 2)
Culture: NO GROWTH
Culture: NO GROWTH

## 2017-07-04 LAB — T3, FREE: T3, Free: 1.7 pg/mL — ABNORMAL LOW (ref 2.0–4.4)

## 2017-07-04 NOTE — Progress Notes (Signed)
[facility]   PULMONARY SERVICE  PROGRESS NOTE  Date of Service: 07/04/2017  Elizabeth Rodriguez  [id number]  DOB: [birth year]   DOA: 06/05/2017  Referring Physician: Carron CurieAli Hijazi, MD  HPI: Elizabeth Rodriguez is a 67 y.o. female seen for follow up of Acute on Chronic Respiratory Failure.  She is doing well weaning on pressure support right now is on 28% oxygen with pressure support of 12.  Doing fairly well.  Apparently a cardiology consultation was requested awaiting results on I think we are waiting for the results on cardiology and if all of that looks good we should be able to hopefully move towards extubation  Medications: Reviewed on Rounds  Physical Exam:  Vitals: Temperature 97.5 pulse 77 respiratory rate 21 blood pressure 110/69 saturations 99%  Ventilator Settings mode of ventilation pressure support FiO2 20% tidal volume 340 PEEP 5 pressure support 12  . General: Comfortable at this time . Eyes: Grossly normal lids, irises & conjunctiva . ENT: grossly tongue is normal . Neck: no obvious mass . Cardiovascular: S1 S2 normal no gallop . Respiratory: No rhonchi expansion is equal . Abdomen: soft . Skin: no rash seen on limited exam . Musculoskeletal: not rigid . Psychiatric:unable to assess . Neurologic: no seizure no involuntary movements         Lab Data:   Basic Metabolic Panel: Recent Labs  Lab 06/29/17 0501 06/30/17 1007 07/02/17 1723 07/03/17 0628 07/04/17 0819  NA 136 138 137 137 136  K 4.1 3.1* 3.0* 2.7* 2.7*  CL 108 106 108 108 105  CO2 20* 24 23 22 23   GLUCOSE 113* 93 109* 90 105*  BUN 11 14 16 18 19   CREATININE 1.21* 1.20* 0.81 0.82 0.82  CALCIUM 8.8* 8.7* 8.9 8.8* 9.2  MG 1.5*  --  1.8  --   --   PHOS 3.5  --   --   --   --     Liver Function Tests: No results for input(s): AST, ALT, ALKPHOS, BILITOT, PROT, ALBUMIN in the last 168 hours. No results for input(s): LIPASE, AMYLASE in the last  168 hours. No results for input(s): AMMONIA in the last 168 hours.  CBC: Recent Labs  Lab 06/29/17 0930 06/30/17 1007  WBC 15.6* 10.4  NEUTROABS 10.7*  --   HGB 10.5* 9.4*  HCT 32.4* 28.8*  MCV 88.5 89.2  PLT 400 282    Cardiac Enzymes: Recent Labs  Lab 06/29/17 0501 07/02/17 1723 07/03/17 0004 07/03/17 0628  TROPONINI 0.03* 0.04* 0.04* 0.03*    BNP (last 3 results) Recent Labs    07/02/17 1723  BNP 178.3*    ProBNP (last 3 results) No results for input(s): PROBNP in the last 8760 hours.  Radiological Exams: No results found.  Assessment/Plan Active Problems:   Chronic atrial fibrillation (HCC)   Acute on chronic respiratory failure with hypoxia (HCC)   Dilated cardiomyopathy (HCC)   Intra-abdominal abscess (HCC)   Severe sepsis with septic shock (HCC)   Multifocal pneumonia   Pleural effusion   1. Acute on chronic respiratory failure with hypoxia we will continue weaning on pressure support weaning slowly towards decannulation 2. Chronic atrial fibrillation rate is controlled we will continue present management. 3. Intra-abdominal abscess treated we will follow-up 4. Severe sepsis with shock right now is stable 5. Multifocal pneumonia treated with antibiotics we will follow 6. Pleural effusion we will monitor   I have personally seen and evaluated the patient,  evaluated laboratory and imaging results, formulated the assessment and plan and placed orders. The Patient requires high complexity decision making for assessment and support.  Case was discussed on Rounds with the Respiratory Therapy Staff  Allyne Gee, MD [facility] Pulmonary Critical Care Medicine Sleep Medicine

## 2017-07-04 NOTE — Consult Note (Signed)
Ref: Shayne AlkenBrown, Stephanie Delores, MD   Subjective:  Awake. Heart rate in 90-110 without metoprolol and 40-50's with metoprolol use. Potassium remais low at 2.7.  Objective:  Vital Signs in the last 24 hours:    Physical Exam: BP Readings from Last 1 Encounters:  06/22/17 121/85     Wt Readings from Last 1 Encounters:  No data found for Wt    Weight change:  There is no height or weight on file to calculate BMI. HEENT: [place]/AT, Eyes-Brown, Conjunctiva-Pink, Sclera-Non-icteric Neck: No JVD, No bruit, Trachea midline. Lungs:  Clearing, Bilateral. Cardiac:  Regular rhythm, normal S1 and S2, no S3. III/VI systolic murmur. Abdomen:  Soft, non-tender. BS present. Extremities:  1 + edema present. No cyanosis. No clubbing. CNS: AxOx2, Cranial nerves grossly intact, moves all 4 extremities.  Skin: Warm and dry.   Intake/Output from previous day: No intake/output data recorded.    Lab Results: BMET    Component Value Date/Time   NA 136 07/04/2017 0819   NA 137 07/03/2017 0628   NA 137 07/02/2017 1723   K 2.7 (LL) 07/04/2017 0819   K 2.7 (LL) 07/03/2017 0628   K 3.0 (L) 07/02/2017 1723   CL 105 07/04/2017 0819   CL 108 07/03/2017 0628   CL 108 07/02/2017 1723   CO2 23 07/04/2017 0819   CO2 22 07/03/2017 0628   CO2 23 07/02/2017 1723   GLUCOSE 105 (H) 07/04/2017 0819   GLUCOSE 90 07/03/2017 0628   GLUCOSE 109 (H) 07/02/2017 1723   BUN 19 07/04/2017 0819   BUN 18 07/03/2017 0628   BUN 16 07/02/2017 1723   CREATININE 0.82 07/04/2017 0819   CREATININE 0.82 07/03/2017 0628   CREATININE 0.81 07/02/2017 1723   CALCIUM 9.2 07/04/2017 0819   CALCIUM 8.8 (L) 07/03/2017 0628   CALCIUM 8.9 07/02/2017 1723   GFRNONAA >60 07/04/2017 0819   GFRNONAA >60 07/03/2017 0628   GFRNONAA >60 07/02/2017 1723   GFRAA >60 07/04/2017 0819   GFRAA >60 07/03/2017 0628   GFRAA >60 07/02/2017 1723   CBC    Component Value Date/Time   WBC 10.4 06/30/2017 1007   RBC 3.23 (L) 06/30/2017 1007   HGB 9.4 (L) 06/30/2017 1007   HCT 28.8 (L) 06/30/2017 1007   PLT 282 06/30/2017 1007   MCV 89.2 06/30/2017 1007   MCH 29.1 06/30/2017 1007   MCHC 32.6 06/30/2017 1007   RDW 15.2 06/30/2017 1007   LYMPHSABS 4.2 (H) 06/29/2017 0930   MONOABS 0.6 06/29/2017 0930   EOSABS 0.0 06/29/2017 0930   BASOSABS 0.0 06/29/2017 0930   HEPATIC Function Panel Recent Labs    04/19/17 0737 06/06/17 0709 06/16/17 1707  PROT 6.7 5.9* 6.5   HEMOGLOBIN A1C No components found for: HGA1C,  MPG CARDIAC ENZYMES Lab Results  Component Value Date   TROPONINI 0.03 (HH) 07/03/2017   TROPONINI 0.04 (HH) 07/03/2017   TROPONINI 0.04 (HH) 07/02/2017   BNP No results for input(s): PROBNP in the last 8760 hours. TSH Recent Labs    07/03/17 0004  TSH 3.675   CHOLESTEROL No results for input(s): CHOL in the last 8760 hours.  Scheduled Meds: . sodium chloride flush  5 mL Intracatheter Q8H   Continuous Infusions: PRN Meds:.  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  Agree with potassium supplementation. May try smaller dose of metoprolol like 125 mg.  twice daily for HR control. Extubation per Dr. Welton Flakes in AM.   LOS: 0 days    Orpah Cobb  MD  07/04/2017, 7:06 PM

## 2017-07-05 DIAGNOSIS — J90 Pleural effusion, not elsewhere classified: Secondary | ICD-10-CM

## 2017-07-05 DIAGNOSIS — R6521 Severe sepsis with septic shock: Secondary | ICD-10-CM | POA: Diagnosis not present

## 2017-07-05 DIAGNOSIS — I420 Dilated cardiomyopathy: Secondary | ICD-10-CM | POA: Diagnosis not present

## 2017-07-05 DIAGNOSIS — J189 Pneumonia, unspecified organism: Secondary | ICD-10-CM | POA: Diagnosis not present

## 2017-07-05 DIAGNOSIS — A419 Sepsis, unspecified organism: Secondary | ICD-10-CM | POA: Diagnosis not present

## 2017-07-05 DIAGNOSIS — J9621 Acute and chronic respiratory failure with hypoxia: Secondary | ICD-10-CM | POA: Diagnosis not present

## 2017-07-05 DIAGNOSIS — I482 Chronic atrial fibrillation: Secondary | ICD-10-CM | POA: Diagnosis not present

## 2017-07-05 LAB — POTASSIUM: POTASSIUM: 4.9 mmol/L (ref 3.5–5.1)

## 2017-07-05 NOTE — Progress Notes (Signed)
[facility]   PULMONARY SERVICE  PROGRESS NOTE  Date of Service: 07/05/2017  Elizabeth DunkerSallie B Rodriguez  ZOX:[id number]  DOB: 1951-01-20   DOA: 06/05/2017  Referring Physician: Carron CurieAli Hijazi, MD  HPI: Elizabeth Rodriguez is a 67 y.o. female seen for follow up of Acute on Chronic Respiratory Failure.  She is doing very well.  Has been on pressure support mode for 24 hours.  She is awake she is alert and answers appropriately.  Should be able to proceed to extubation at this stage.  I also did explain to the patient as well as the daughter that there is a small chance that she could fail extubation and would have to be reintubated.  They understand this risk and are willing to proceed as indicated  Medications: Reviewed on Rounds  Physical Exam:  Vitals: Temperature 99.2 pulse 103 respiratory rate 17 blood pressure 133/78 saturations 98%  Ventilator Settings mode of ventilation pressure support FiO2 28% tidal volume 427cc pressure support 12 PEEP 5  . General: Comfortable at this time . Eyes: Grossly normal lids, irises & conjunctiva . ENT: grossly tongue is normal . Neck: no obvious mass . Cardiovascular: S1 S2 normal no gallop . Respiratory: No rhonchi or rales are noted at this time. . Abdomen: soft . Skin: no rash seen on limited exam . Musculoskeletal: not rigid . Psychiatric:unable to assess . Neurologic: no seizure no involuntary movements         Lab Data:   Basic Metabolic Panel: Recent Labs  Lab 06/29/17 0501 06/30/17 1007 07/02/17 1723 07/03/17 0628 07/04/17 0819 07/05/17 0634  NA 136 138 137 137 136  --   K 4.1 3.1* 3.0* 2.7* 2.7* 4.9  CL 108 106 108 108 105  --   CO2 20* 24 23 22 23   --   GLUCOSE 113* 93 109* 90 105*  --   BUN 11 14 16 18 19   --   CREATININE 1.21* 1.20* 0.81 0.82 0.82  --   CALCIUM 8.8* 8.7* 8.9 8.8* 9.2  --   MG 1.5*  --  1.8  --   --   --   PHOS 3.5  --   --   --   --   --     Liver Function  Tests: No results for input(s): AST, ALT, ALKPHOS, BILITOT, PROT, ALBUMIN in the last 168 hours. No results for input(s): LIPASE, AMYLASE in the last 168 hours. No results for input(s): AMMONIA in the last 168 hours.  CBC: Recent Labs  Lab 06/29/17 0930 06/30/17 1007  WBC 15.6* 10.4  NEUTROABS 10.7*  --   HGB 10.5* 9.4*  HCT 32.4* 28.8*  MCV 88.5 89.2  PLT 400 282    Cardiac Enzymes: Recent Labs  Lab 06/29/17 0501 07/02/17 1723 07/03/17 0004 07/03/17 0628  TROPONINI 0.03* 0.04* 0.04* 0.03*    BNP (last 3 results) Recent Labs    07/02/17 1723  BNP 178.3*    ProBNP (last 3 results) No results for input(s): PROBNP in the last 8760 hours.  Radiological Exams: No results found.  Assessment/Plan Active Problems:   Chronic atrial fibrillation (HCC)   Acute on chronic respiratory failure with hypoxia (HCC)   Dilated cardiomyopathy (HCC)   Intra-abdominal abscess (HCC)   Severe sepsis with septic shock (HCC)   Multifocal pneumonia   Pleural effusion   1. Acute on chronic respiratory failure with hypoxia clinically improved we will proceed towards extubation.  I feel like  the primary cause of her respiratory failure may have been related to narcotics which we need to cut back on. 2. Chronic atrial fibrillation rate is controlled we will continue to follow. 3. Dilated cardiomyopathy right now is stable we will continue to monitor 4. Multifocal pneumonia treated with antibiotics. 5. Pleural effusion follow-up x-rays as necessary. 6. Severe sepsis clinically resolved we will monitor 7. Intra-abdominal abscess treated we will continue to follow along   I have personally seen and evaluated the patient, evaluated laboratory and imaging results, formulated the assessment and plan and placed orders. The Patient requires high complexity decision making for assessment and support.  Case was discussed on Rounds with the Respiratory Therapy Staff  Yevonne Pax, MD  [facility] Pulmonary Critical Care Medicine Sleep Medicine

## 2017-07-05 NOTE — Consult Note (Signed)
Ref: Shayne Alken, MD   Subjective:  Awake. Extubated. Maintaining 99 to 100 % oxygen level. Heart rate in 90's to 110's. Monitor shows atrial fibrillation. Hypokalemia resolved.  Objective:  Vital Signs in the last 24 hours:    Physical Exam: BP Readings from Last 1 Encounters:  06/22/17 121/85     Wt Readings from Last 1 Encounters:  No data found for Wt    Weight change:  There is no height or weight on file to calculate BMI. HEENT: [place]/AT, Eyes-Brown, Conjunctiva-Pale pink, Sclera-Non-icteric Neck: No JVD, No bruit, Trachea midline. Lungs:  Clearing, Bilateral. Cardiac:  Regular rhythm, normal S1 and S2, no S3. III/VI systolic murmur. Abdomen:  Soft, non-tender. BS present. Extremities:  2 + edema present. No cyanosis. No clubbing. CNS: AxOx3, Cranial nerves grossly intact, moves all 4 extremities.  Skin: Warm and dry.   Intake/Output from previous day: No intake/output data recorded.    Lab Results: BMET    Component Value Date/Time   NA 136 07/04/2017 0819   NA 137 07/03/2017 0628   NA 137 07/02/2017 1723   K 4.9 07/05/2017 0634   K 2.7 (LL) 07/04/2017 0819   K 2.7 (LL) 07/03/2017 0628   CL 105 07/04/2017 0819   CL 108 07/03/2017 0628   CL 108 07/02/2017 1723   CO2 23 07/04/2017 0819   CO2 22 07/03/2017 0628   CO2 23 07/02/2017 1723   GLUCOSE 105 (H) 07/04/2017 0819   GLUCOSE 90 07/03/2017 0628   GLUCOSE 109 (H) 07/02/2017 1723   BUN 19 07/04/2017 0819   BUN 18 07/03/2017 0628   BUN 16 07/02/2017 1723   CREATININE 0.82 07/04/2017 0819   CREATININE 0.82 07/03/2017 0628   CREATININE 0.81 07/02/2017 1723   CALCIUM 9.2 07/04/2017 0819   CALCIUM 8.8 (L) 07/03/2017 0628   CALCIUM 8.9 07/02/2017 1723   GFRNONAA >60 07/04/2017 0819   GFRNONAA >60 07/03/2017 0628   GFRNONAA >60 07/02/2017 1723   GFRAA >60 07/04/2017 0819   GFRAA >60 07/03/2017 0628   GFRAA >60 07/02/2017 1723   CBC    Component Value Date/Time   WBC 10.4 06/30/2017 1007   RBC 3.23 (L) 06/30/2017 1007   HGB 9.4 (L) 06/30/2017 1007   HCT 28.8 (L) 06/30/2017 1007   PLT 282 06/30/2017 1007   MCV 89.2 06/30/2017 1007   MCH 29.1 06/30/2017 1007   MCHC 32.6 06/30/2017 1007   RDW 15.2 06/30/2017 1007   LYMPHSABS 4.2 (H) 06/29/2017 0930   MONOABS 0.6 06/29/2017 0930   EOSABS 0.0 06/29/2017 0930   BASOSABS 0.0 06/29/2017 0930   HEPATIC Function Panel Recent Labs    04/19/17 0737 06/06/17 0709 06/16/17 1707  PROT 6.7 5.9* 6.5   HEMOGLOBIN A1C No components found for: HGA1C,  MPG CARDIAC ENZYMES Lab Results  Component Value Date   TROPONINI 0.03 (HH) 07/03/2017   TROPONINI 0.04 (HH) 07/03/2017   TROPONINI 0.04 (HH) 07/02/2017   BNP No results for input(s): PROBNP in the last 8760 hours. TSH Recent Labs    07/03/17 0004  TSH 3.675   CHOLESTEROL No results for input(s): CHOL in the last 8760 hours.  Scheduled Meds: . sodium chloride flush  5 mL Intracatheter Q8H   Continuous Infusions: PRN Meds:.  Assessment/Plan: Chronic atrial fibrillation with moderate ventricular response Acute on chronic respiratory failure, improved Intra-abdominal abscess Bilateral pleural effusion Anemia of chronic disease Hypokalemia-resolved Severe hypoalbuminemia Acute on chronic renal failure.  Will try oral amiodarone for heart rate control  as patient is very sensitive to B-blocker use.   LOS: 0 days    Elizabeth CobbAjay Burnette Valenti  MD  07/05/2017, 6:02 PM

## 2017-07-06 LAB — VANCOMYCIN, TROUGH: VANCOMYCIN TR: 15 ug/mL (ref 15–20)

## 2017-07-07 ENCOUNTER — Other Ambulatory Visit (HOSPITAL_COMMUNITY): Payer: Medicare Other

## 2017-07-08 ENCOUNTER — Other Ambulatory Visit (HOSPITAL_COMMUNITY): Payer: Medicare Other

## 2017-07-08 LAB — CBC
HEMATOCRIT: 34.2 % — AB (ref 36.0–46.0)
HEMOGLOBIN: 10.9 g/dL — AB (ref 12.0–15.0)
MCH: 29.2 pg (ref 26.0–34.0)
MCHC: 31.9 g/dL (ref 30.0–36.0)
MCV: 91.7 fL (ref 78.0–100.0)
Platelets: 368 10*3/uL (ref 150–400)
RBC: 3.73 MIL/uL — ABNORMAL LOW (ref 3.87–5.11)
RDW: 16 % — AB (ref 11.5–15.5)
WBC: 5.9 10*3/uL (ref 4.0–10.5)

## 2019-06-24 IMAGING — CT CT ABD-PELV W/ CM
2 of 5 series · 15 of 46 positions shown, 17 images · IV contrast (APPLIED)
Comparison: 06/16/2017

CLINICAL DATA: Intra-abdominal abscess, follow-up

EXAM:
CT ABDOMEN AND PELVIS WITH CONTRAST
TECHNIQUE: Multidetector CT imaging of the abdomen and pelvis was performed
using the standard protocol following bolus administration of
intravenous contrast. Sagittal and coronal MPR images reconstructed
from axial data set. Scattered beam hardening artifacts secondary to
inclusion of patient's arms in imaged field, as result of clinical
condition.
CONTRAST:  100mL OMNIPAQUE IOHEXOL 300 MG/ML SOLN IV. Dilute oral
contrast.

[Series 3: abd/ pelvis 5.0 efov · axial · 1.27mm/px · z∈[+947,+1367]mm · 12 of 94 slices shown, 14 images]
[im 5/94  soft-tissue]
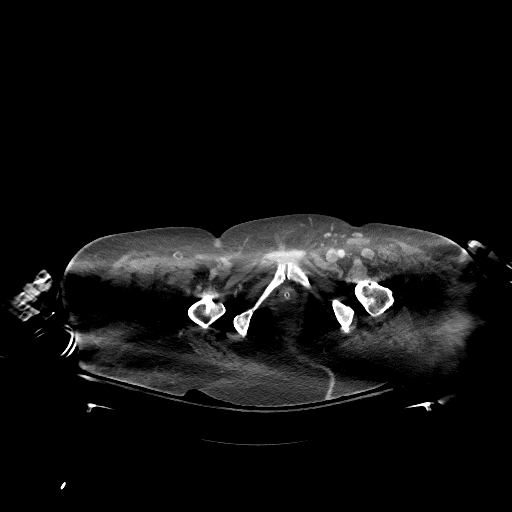
[im 5/94  bone]
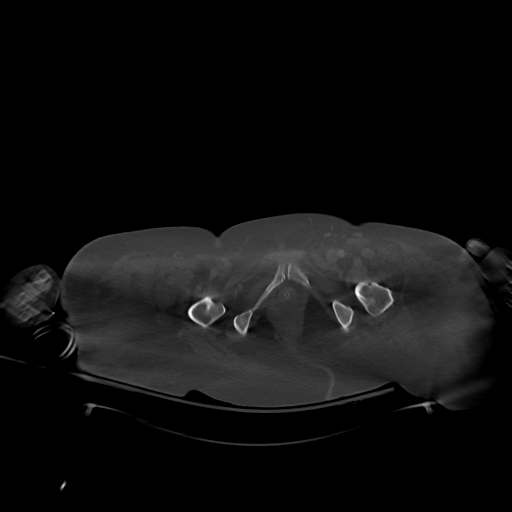
[im 15/94  soft-tissue]
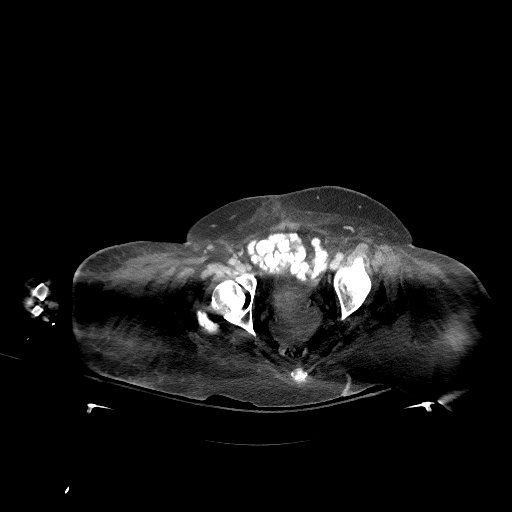
[im 20/94  soft-tissue]
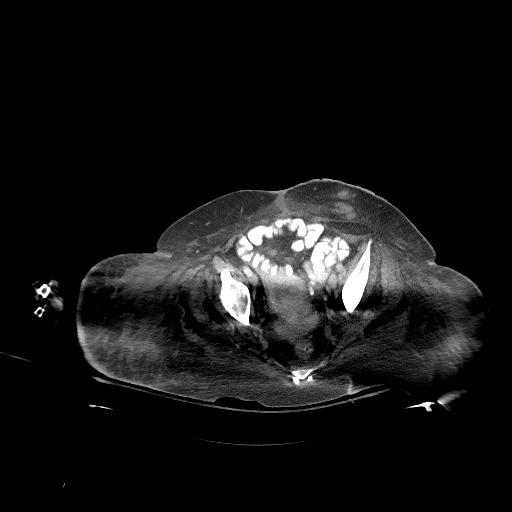
[im 30/94  soft-tissue]
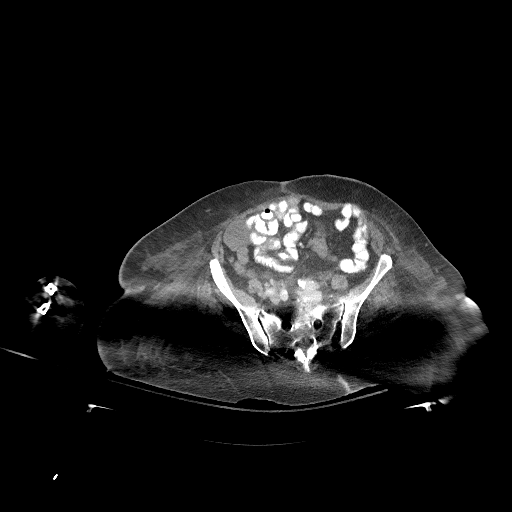
[im 35/94  soft-tissue]
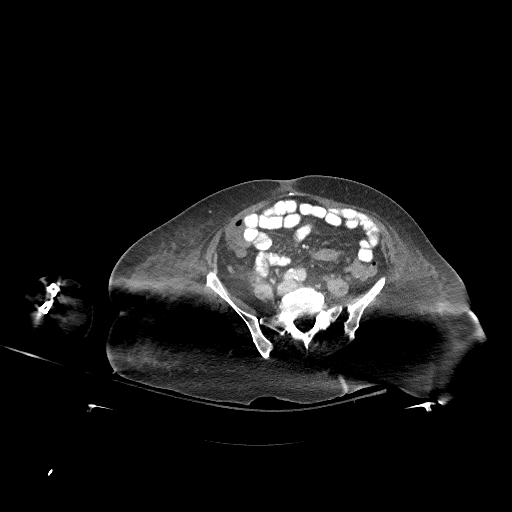
[im 45/94  soft-tissue]
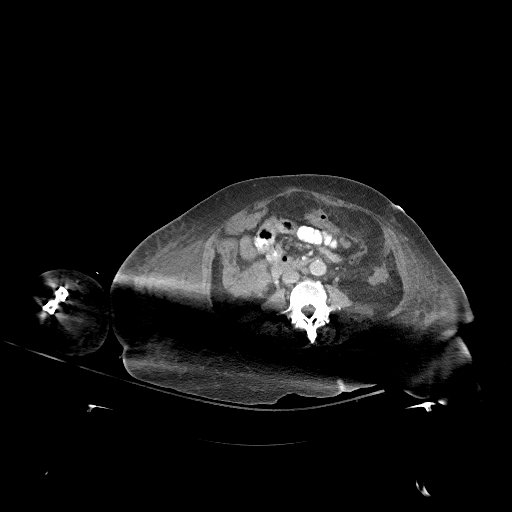
[im 49/94  soft-tissue]
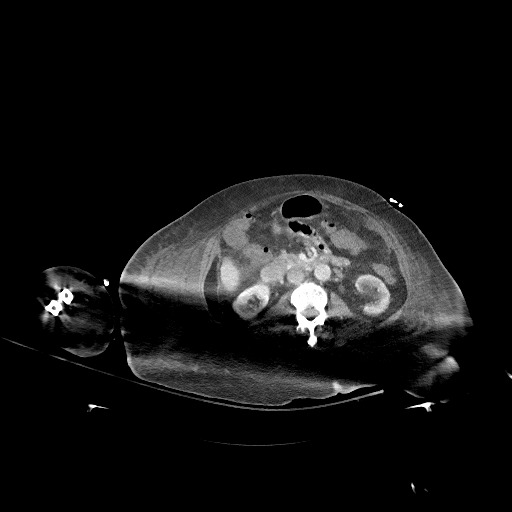
[im 59/94  soft-tissue]
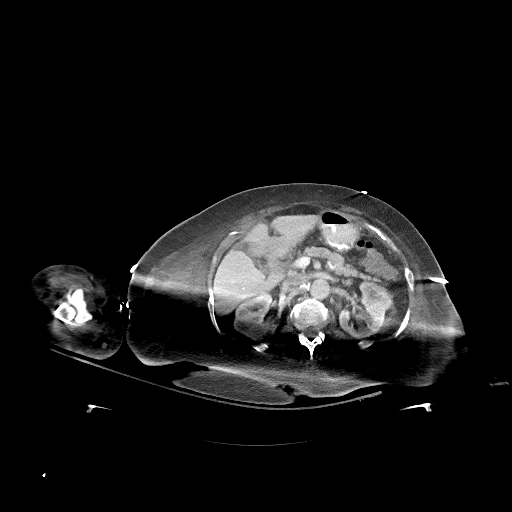
[im 64/94  soft-tissue]
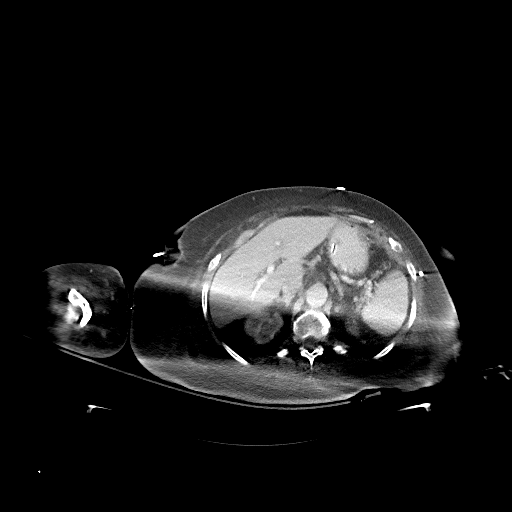
[im 64/94  bone]
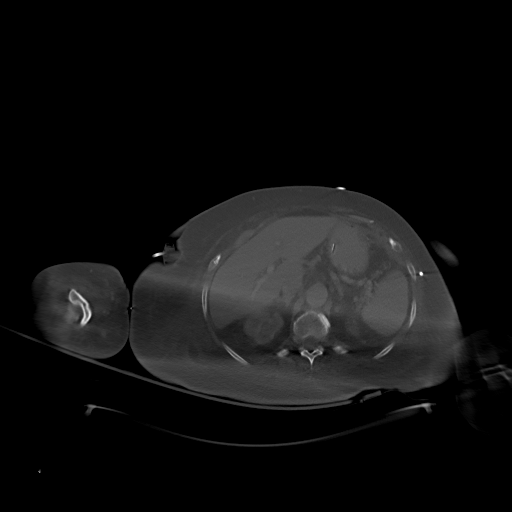
[im 74/94  soft-tissue]
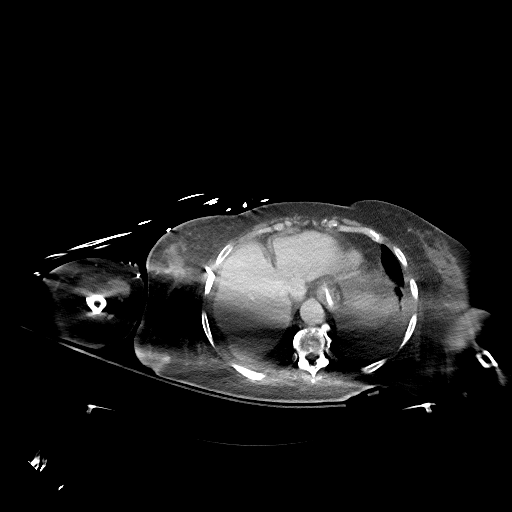
[im 79/94  soft-tissue]
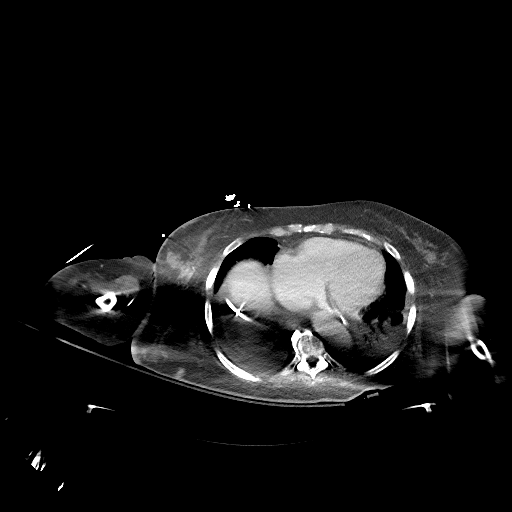
[im 89/94  soft-tissue]
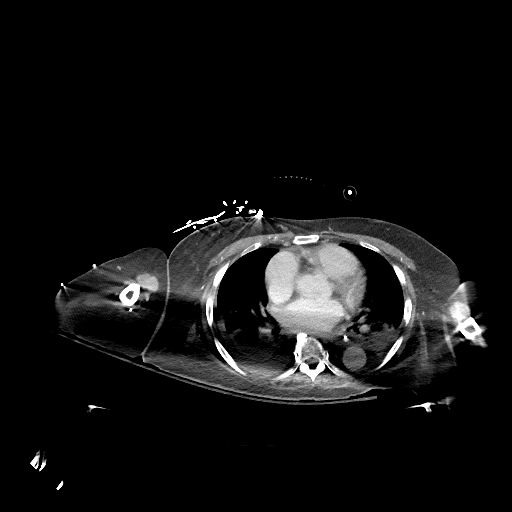

[Series 6: coronal soft tissue · coronal · 0.91mm/px · 3 of 100 slices shown]
[im 34/100  soft-tissue]
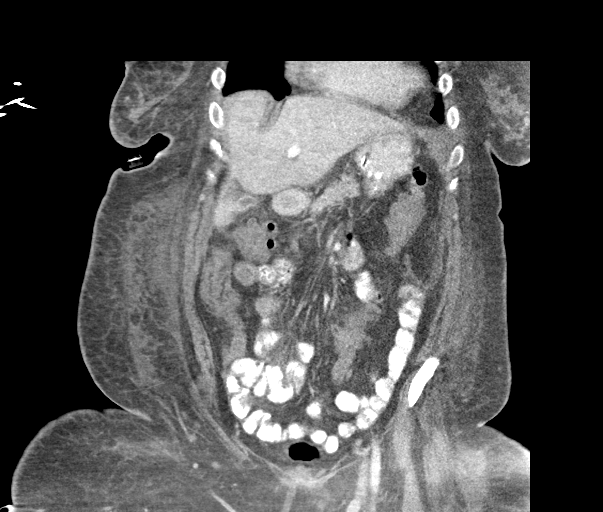
[im 45/100  soft-tissue]
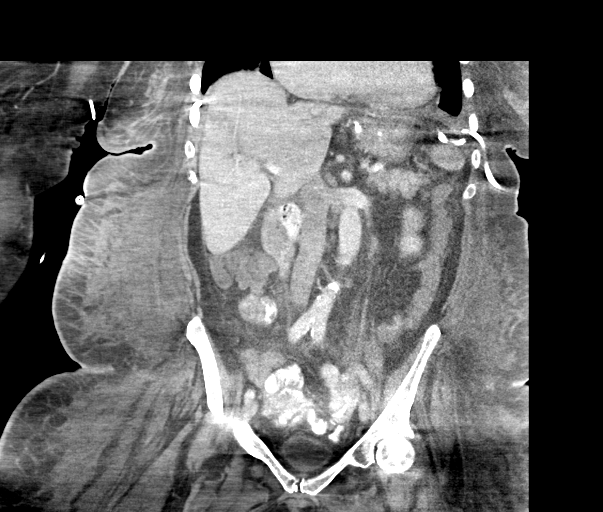
[im 56/100  soft-tissue]
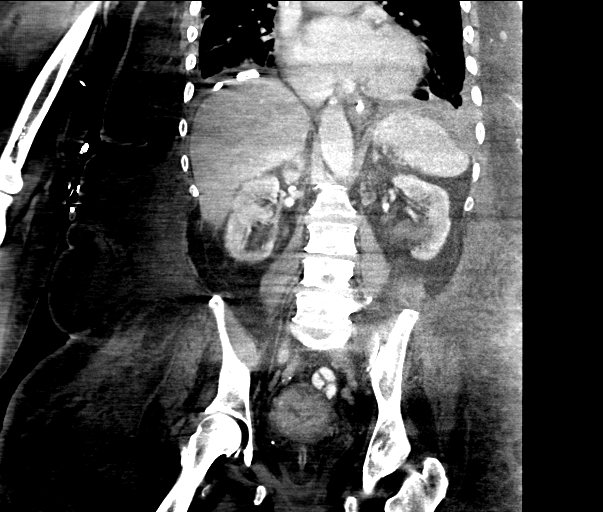

[15 of 46 positions shown; findings below may reference images not displayed]

FINDINGS: Lower chest: Bibasilar pleural effusions and atelectasis versus
consolidation in the lower lobes.

Hepatobiliary: Pigtail drainage catheter in the suprahepatic space
with minimal residual fluid. Contracted gallbladder with a calcified
gallstone. Liver unremarkable.

Pancreas: Unremarkable

Spleen: Normal appearance adjacent drainage catheter in the LEFT
subdiaphragmatic space with minimal residual fluid collection.

Adrenals/Urinary Tract: Adrenal glands unremarkable. BILATERAL renal
cortical atrophy. Peripelvic renal cysts LEFT kidney. Proximal LEFT
ureter is minimally prominent but no ureteral calcification is seen.
Bladder contains a Foley catheter as well as a small amount of urine
and air.

Stomach/Bowel: Normal appendix. Scattered colonic diverticulosis.
Sigmoid colostomy LEFT lower quadrant. Deivi [place]. Nasogastric
tube in stomach. Stomach and bowel loops otherwise unremarkable.

Vascular/Lymphatic: Atherosclerotic calcifications aorta and iliac
arteries. Minimal coronary arterial calcification. Enlargement of
heart. No definite adenopathy.

Reproductive: Prominent central low attenuation within uterus for
age. Unremarkable adnexa.

Other: Small loculated fluid collection in the pelvis question
resolving postoperative hemorrhage seen on previous exam. Scattered
stranding of intra-abdominal, mesenteric and retroperitoneal tissue
planes, nonspecific. Scattered subcutaneous edema. No additional
focal fluid collections. No hernia.

Musculoskeletal: Unremarkable
IMPRESSION: Limitations of exam due to beam hardening artifacts.

Significantly decreased subdiaphragmatic collections bilaterally
post interval placement of pigtail drainage catheters.

Complex collection in the pelvis question resolving postoperative
blood.

Colonic diverticulosis.

Cholelithiasis.

Extensive soft tissue edema both within the body wall, within
mesentery, and retroperitoneum, could be related to hyperproteinemia
or anasarca.

BILATERAL pleural effusions with bibasilar atelectasis versus
infiltrate.

Prominent central low attenuation within the uterus for age;
recommend assessment by follow-up pelvic/transvaginal sonography
after resolution of the patient's current acute process.
